# Patient Record
Sex: Female | Born: 1985 | Race: White | Hispanic: No | Marital: Married | Smoking: Never smoker
Health system: Southern US, Community
[De-identification: ages and names within clinical notes are randomized; demographics above are authoritative.]

## PROBLEM LIST (undated history)

## (undated) DIAGNOSIS — J45909 Unspecified asthma, uncomplicated: Secondary | ICD-10-CM

## (undated) DIAGNOSIS — G43909 Migraine, unspecified, not intractable, without status migrainosus: Secondary | ICD-10-CM

## (undated) DIAGNOSIS — R002 Palpitations: Secondary | ICD-10-CM

## (undated) DIAGNOSIS — R51 Headache: Secondary | ICD-10-CM

## (undated) DIAGNOSIS — B019 Varicella without complication: Secondary | ICD-10-CM

## (undated) HISTORY — DX: Headache: R51

## (undated) HISTORY — DX: Palpitations: R00.2

## (undated) HISTORY — PX: NO PAST SURGERIES: SHX2092

## (undated) HISTORY — DX: Varicella without complication: B01.9

## (undated) HISTORY — DX: Migraine, unspecified, not intractable, without status migrainosus: G43.909

## (undated) HISTORY — DX: Unspecified asthma, uncomplicated: J45.909

---

## 2012-05-02 ENCOUNTER — Ambulatory Visit: Payer: Self-pay | Admitting: Family Medicine

## 2012-05-15 ENCOUNTER — Ambulatory Visit (INDEPENDENT_AMBULATORY_CARE_PROVIDER_SITE_OTHER): Payer: BC Managed Care – PPO | Admitting: Family Medicine

## 2012-05-15 ENCOUNTER — Encounter: Payer: Self-pay | Admitting: Family Medicine

## 2012-05-15 VITALS — BP 100/72 | HR 73 | Temp 98.9°F | Ht 65.75 in | Wt 223.0 lb

## 2012-05-15 DIAGNOSIS — Z7689 Persons encountering health services in other specified circumstances: Secondary | ICD-10-CM

## 2012-05-15 DIAGNOSIS — R05 Cough: Secondary | ICD-10-CM

## 2012-05-15 DIAGNOSIS — R0982 Postnasal drip: Secondary | ICD-10-CM

## 2012-05-15 DIAGNOSIS — E669 Obesity, unspecified: Secondary | ICD-10-CM

## 2012-05-15 DIAGNOSIS — Z23 Encounter for immunization: Secondary | ICD-10-CM

## 2012-05-15 DIAGNOSIS — J45909 Unspecified asthma, uncomplicated: Secondary | ICD-10-CM

## 2012-05-15 DIAGNOSIS — Z7189 Other specified counseling: Secondary | ICD-10-CM

## 2012-05-15 MED ORDER — FLUTICASONE PROPIONATE 50 MCG/ACT NA SUSP: 2.0000 | Freq: Every day | NASAL | Status: DC

## 2012-05-15 MED ORDER — ALBUTEROL SULFATE HFA 108 (90 BASE) MCG/ACT IN AERS: 2.0000 | INHALATION_SPRAY | Freq: Four times a day (QID) | RESPIRATORY_TRACT | Status: DC | PRN

## 2012-05-15 NOTE — Addendum Note (Signed)
Addended by: Azucena Freed on: 05/15/2012 03:47 PM   Modules accepted: Orders

## 2012-05-15 NOTE — Patient Instructions (Addendum)
-  We have ordered labs or studies at this visit. It can take up to 1-2 weeks for results and processing. We will contact you with instructions IF your results are abnormal. Normal results will be released to your [facility]. If you have not heard from [place] or can not find your results in [facility] in 2 weeks please contact our office.  -PLEASE SIGN UP FOR MYCHART TODAY   We recommend the following healthy lifestyle measures: - eat a healthy diet consisting of lots of vegetables, fruits, beans, nuts, seeds, healthy meats such as white chicken and fish and whole grains.  - avoid fried foods, fast food, processed foods, sodas, red meet and other fattening foods.  - get a least 150 minutes of aerobic exercise per week.   FOR YOUR COUGH: -We placed a referral for you for lung testing as discussed. It usually takes about 1-2 weeks to process and schedule this referral. If you have not heard from [place] regarding this appointment in 2 weeks please contact our office. -please use the albuterol and flonase as instructed  Follow up in: 1 month or sooner if any worsening of symptoms or concerns

## 2012-05-15 NOTE — Progress Notes (Addendum)
Chief Complaint  Patient presents with  . Establish Care    HPI:  Diane Wheeler is here to establish care.  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: married Spiritual Beliefs: christian Exercise: gym 3-4 times per week cardio and strength training - has lost 10lbs, has been working on diet - cutting out diet and greasy foods and sweet and balancing portions  Has the following chronic problems and concerns today:  Cough: -worse every winter -symptoms: productive cough ongoing for a bout a month and yearly in the winter, drainage in throat, occ rhinorrhea -denies: fevers, wheezing, SOB, though does have some wheezing of runs outside (not inside), hx of childhood asthma - but no tx for asthma since childhood, unintentional weight loss -no reflux symptoms, SOB, fevers  Other Providers: -[facility] - last exam in feb with pap, normal paps  Health Maintenance: -UTD on pap -had flu this year, last tetanus booster 2003 -had lipids and fasting blood glu tested Nov 2013 total chol 159, LDL 83, fasting blood glu 87  ROS: See pertinent positives and negatives per HPI.  Past Medical History  Diagnosis Date  . Chicken pox   . Headache     frequent   . Migraines   . Asthma     as a child, occ flares when runs outside    Family History  Problem Relation Age of Onset  . Alcohol abuse Other     father's side of family  . Arthritis Maternal Grandfather   . Arthritis Maternal Grandmother   . Heart disease    . Ovarian cancer Maternal Aunt   . Uterine cancer Maternal Aunt   . Hypertension Mother   . Hypertension Maternal Grandmother   . Depression      maternal side  . Anxiety disorder      maternal side   . Diabetes Mother   . Diabetes Maternal Grandfather   . Stroke Maternal Grandfather   . Stroke Maternal Grandmother   . Heart disease Maternal Grandfather   . Heart disease Paternal Grandmother     History   Social History  .  Marital Status: Married    Spouse Name: N/A    Number of Children: N/A  . Years of Education: N/A   Social History Main Topics  . Smoking status: Never Smoker   . Smokeless tobacco: None  . Alcohol Use: Yes     Comment: occ, under safe drinking limits  . Drug Use: No  . Sexually Active: Yes    Birth Control/ Protection: Condom     Comment: doesn't tolerate OCPs - doesn't like the side effects   Other Topics Concern  . None   Social History Narrative  . None    Current outpatient prescriptions:albuterol (PROVENTIL HFA;VENTOLIN HFA) 108 (90 BASE) MCG/ACT inhaler, Inhale 2 puffs into the lungs every 6 (six) hours as needed for wheezing., Disp: 1 Inhaler, Rfl: 2;  fluticasone (FLONASE) 50 MCG/ACT nasal spray, Place 2 sprays into the nose daily., Disp: 16 g, Rfl: 6  EXAM:  Filed Vitals:   05/15/12 1428  BP: 100/72  Pulse: 73  Temp: 98.9 F (37.2 C)    Body mass index is 36.27 kg/(m^2).  GENERAL: vitals reviewed and listed above, alert, oriented, appears well hydrated and in no acute distress  HEENT: atraumatic, conjunttiva clear, no obvious abnormalities on inspection of external nose and ears, allergic shiners, PND, pale boggy nasal turbinates  NECK: no obvious masses on inspection  LUNGS: clear to auscultation bilaterally, no wheezes, rales or rhonchi, good air movement  CV: HRRR, no peripheral edema  MS: moves all extremities without noticeable abnormality  PSYCH: pleasant and cooperative, no obvious depression or anxiety  ASSESSMENT AND PLAN:  Discussed the following assessment and plan:  1. Obesity (BMI 30-39.9)    2. Encounter to establish care    3. Cough  -exam c/w  AR with PND - however with hx of asthma will rx albuterol and obtain PFTs Pulmonary function test, albuterol, flonase, return precautions -follow up 1 month  4. Asthma  Pulmonary function test, albuterol (PROVENTIL HFA;VENTOLIN HFA) 108 (90 BASE) MCG/ACT inhaler  5. PND (post-nasal drip)      -We reviewed the PMH, PSH, FH, SH, Meds and Allergies. -We provided refills for any medications we will prescribe as needed. -We addressed current concerns per orders and patient instructions. -We have asked for records for pertinent exams, studies, vaccines and notes from previous providers. -We have advised patient to follow up per instructions below. -tdap vaccine given today  -Patient advised to return or notify a doctor immediately if symptoms worsen or persist or new concerns arise.  Patient Instructions  -We have ordered labs or studies at this visit. It can take up to 1-2 weeks for results and processing. We will contact you with instructions IF your results are abnormal. Normal results will be released to your [facility]. If you have not heard from [place] or can not find your results in [facility] in 2 weeks please contact our office.  -PLEASE SIGN UP FOR MYCHART TODAY   We recommend the following healthy lifestyle measures: - eat a healthy diet consisting of lots of vegetables, fruits, beans, nuts, seeds, healthy meats such as white chicken and fish and whole grains.  - avoid fried foods, fast food, processed foods, sodas, red meet and other fattening foods.  - get a least 150 minutes of aerobic exercise per week.   FOR YOUR COUGH: -We placed a referral for you for lung testing as discussed. It usually takes about 1-2 weeks to process and schedule this referral. If you have not heard from [place] regarding this appointment in 2 weeks please contact our office. -please use the albuterol and flonase as instructed  Follow up in: 1 month or sooner if any worsening of symptoms or concerns      KIM, HANNAH R.

## 2012-06-23 ENCOUNTER — Ambulatory Visit (INDEPENDENT_AMBULATORY_CARE_PROVIDER_SITE_OTHER): Payer: BC Managed Care – PPO | Admitting: Internal Medicine

## 2012-06-23 DIAGNOSIS — R05 Cough: Secondary | ICD-10-CM

## 2012-06-23 DIAGNOSIS — J45909 Unspecified asthma, uncomplicated: Secondary | ICD-10-CM

## 2012-06-23 LAB — PULMONARY FUNCTION TEST

## 2012-06-23 NOTE — Progress Notes (Signed)
PFT done today. 

## 2012-07-08 ENCOUNTER — Telehealth: Payer: Self-pay | Admitting: Family Medicine

## 2012-07-08 NOTE — Telephone Encounter (Signed)
Please let her know:  Lung testing did not show asthma. There were a few minor things we can discuss. She needs a follow up appointment to see how her cough is going - please schedule.

## 2012-07-11 NOTE — Telephone Encounter (Signed)
Left message on voicemail.

## 2012-07-12 NOTE — Telephone Encounter (Signed)
Left a message for return call.  

## 2012-07-13 NOTE — Telephone Encounter (Signed)
Called and spoke with pt about testing.  Pt states she made an appt for the 27th of Feb.  Advised pt to call office back for sooner appt if symptoms worsen or pt has new symptoms.    Olegario Messier spoke with pt and made appt.

## 2012-08-03 ENCOUNTER — Ambulatory Visit (INDEPENDENT_AMBULATORY_CARE_PROVIDER_SITE_OTHER): Payer: BC Managed Care – PPO | Admitting: Family Medicine

## 2012-08-03 ENCOUNTER — Encounter: Payer: Self-pay | Admitting: Family Medicine

## 2012-08-03 VITALS — BP 102/76 | HR 76 | Temp 98.9°F | Wt 220.0 lb

## 2012-08-03 DIAGNOSIS — Z309 Encounter for contraceptive management, unspecified: Secondary | ICD-10-CM

## 2012-08-03 NOTE — Patient Instructions (Signed)
-  We placed a referral for you as discussed. It usually takes about 1-2 weeks to process and schedule this referral. If you have not heard from [place] regarding this appointment in 2 weeks please contact our office.  -continue the Flonase  -follow up with me in 3 months or sooner if concerns

## 2012-08-03 NOTE — Progress Notes (Signed)
Chief Complaint  Patient presents with  . Follow-up    to discuss PFT results  . Contraception    HPI:  Cough/PND: -every winter, childhood asthma, wheezing when runs outside in cold air -PFTs without obstructive disease and without response to bronchodilator, mild restrictive pattern with reduced diffusion cap -last visit had tried flonase and alb prior to PFTs  -currently reports symptoms have improved in terms of the PND -still having nonproductive cough daily and admits to getting SOB fairly easily  ROS: See pertinent positives and negatives per HPI.  Past Medical History  Diagnosis Date  . Chicken pox   . Headache     frequent   . Migraines   . Asthma     as a child, occ flares when runs outside    Family History  Problem Relation Age of Onset  . Alcohol abuse Other     father's side of family  . Arthritis Maternal Grandfather   . Arthritis Maternal Grandmother   . Heart disease    . Ovarian cancer Maternal Aunt   . Uterine cancer Maternal Aunt   . Hypertension Mother   . Hypertension Maternal Grandmother   . Depression      maternal side  . Anxiety disorder      maternal side   . Diabetes Mother   . Diabetes Maternal Grandfather   . Stroke Maternal Grandfather   . Stroke Maternal Grandmother   . Heart disease Maternal Grandfather   . Heart disease Paternal Grandmother     History   Social History  . Marital Status: Married    Spouse Name: N/A    Number of Children: N/A  . Years of Education: N/A   Social History Main Topics  . Smoking status: Never Smoker   . Smokeless tobacco: None  . Alcohol Use: Yes     Comment: occ, under safe drinking limits  . Drug Use: No  . Sexually Active: Yes    Birth Control/ Protection: Condom     Comment: doesn't tolerate OCPs - doesn't like the side effects   Other Topics Concern  . 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      Home Situation: married      Spiritual Beliefs: christian      Exercise: gym 3-4 times per week cardio and strength training, has been working on diet - cutting out diet and greasy foods and sweet and balancing portions    Current outpatient prescriptions:albuterol (PROVENTIL HFA;VENTOLIN HFA) 108 (90 BASE) MCG/ACT inhaler, Inhale 2 puffs into the lungs every 6 (six) hours as needed for wheezing., Disp: 1 Inhaler, Rfl: 2;  fluticasone (FLONASE) 50 MCG/ACT nasal spray, Place 2 sprays into the nose daily., Disp: 16 g, Rfl: 6  EXAM:  Filed Vitals:   08/03/12 1514  BP: 102/76  Pulse: 76  Temp: 98.9 F (37.2 C)    Body mass index is 35.78 kg/(m^2).  GENERAL: vitals reviewed and listed above, alert, oriented, appears well hydrated and in no acute distress  HEENT: atraumatic, conjunttiva clear, no obvious abnormalities on inspection of external nose and ears  NECK: no obvious masses on inspection  LUNGS: clear to auscultation bilaterally, no wheezes, rales or rhonchi, good air movement  CV: HRRR, no peripheral edema  MS: moves all extremities without noticeable abnormality  PSYCH: pleasant and cooperative, no obvious depression or anxiety  ASSESSMENT AND PLAN:  Discussed the following assessment and plan:  Chronic  cough - Plan: Ambulatory referral to Pulmonology  Dyspnea - Plan: Ambulatory referral to Pulmonology  Allergic rhinitis  Contraception  -given ongoing symptoms of SOB and cough for several years and PFT findings not suggesting asthma but with mild restrictive pattern and decreased diffusion will refer to pulm for further evaluation -discussed that obesity may contribute to these findings, but discussed other potential etiologies as well - she does admit to a case of very bad pneumonia as a child -patient is agreeable to this plan  -she will continue the flonase -discussed different types of contraception and she will discuss and decide with her gynecologist -Patient advised to return or  notify a doctor immediately if symptoms worsen or persist or new concerns arise.  Patient Instructions  -We placed a referral for you as discussed. It usually takes about 1-2 weeks to process and schedule this referral. If you have not heard from [place] regarding this appointment in 2 weeks please contact our office.  -continue the Flonase  -follow up with me in 3 months or sooner if concerns     Diane Meath R.

## 2012-08-11 ENCOUNTER — Institutional Professional Consult (permissible substitution): Payer: BC Managed Care – PPO | Admitting: Pulmonary Disease

## 2012-09-08 ENCOUNTER — Ambulatory Visit (INDEPENDENT_AMBULATORY_CARE_PROVIDER_SITE_OTHER): Payer: BC Managed Care – PPO | Admitting: Pulmonary Disease

## 2012-09-08 ENCOUNTER — Encounter: Payer: Self-pay | Admitting: Pulmonary Disease

## 2012-09-08 ENCOUNTER — Ambulatory Visit (INDEPENDENT_AMBULATORY_CARE_PROVIDER_SITE_OTHER)
Admission: RE | Admit: 2012-09-08 | Discharge: 2012-09-08 | Disposition: A | Discharge status: Home / Self Care | Payer: BC Managed Care – PPO | Source: Ambulatory Visit | Attending: Pulmonary Disease | Admitting: Pulmonary Disease

## 2012-09-08 VITALS — BP 128/82 | HR 69 | Temp 97.9°F | Ht 66.0 in | Wt 221.0 lb

## 2012-09-08 DIAGNOSIS — R942 Abnormal results of pulmonary function studies: Secondary | ICD-10-CM

## 2012-09-08 NOTE — Patient Instructions (Addendum)
Your breathing studies show normal airflow, but you are not taking a normal depth of breath that I suspect is weight related.   Will check a chest xray today to make sure no obvious abnormality in the lungs As long as you are asymptomatic, feeling you are doing well, no further evaluation needed.

## 2012-09-08 NOTE — Assessment & Plan Note (Signed)
The patient has normal spirometry, but does have minimal decrease in total lung capacity with a mild decrease in diffusion capacity.  She is denying any significant dyspnea on exertion except with heavier activities, and her prior cough has almost totally resolved with treatment of postnasal drip.  She tells me that she is exercising on a regular basis, and that her breathing has not interfered with this.  I had a long discussion with her about her pulmonary function studies, and explained that normal spirometry does not exclude the possibility of asthma.  If she begins to have more consistent symptoms, I would like to see her back to see if we can document airflow limitation when she is symptomatic.  The other option is to check a methacholine challenge test for definitive diagnosis.  Regarding her minimal restriction in diffusion abnormality, I suspect this is secondary to her obesity.  Her diffusion capacity corrects with alveolar volume adjustment.  I will check a chest x-ray for completeness to make sure that she does not have an interstitial process, but her lungs were clear today on exam.  If her chest x-ray is normal, I will see her back on an as needed basis.  I have encouraged her to work aggressively on weight loss.

## 2012-09-08 NOTE — Progress Notes (Signed)
  Subjective:    Patient ID: Diane Wheeler, female    DOB: 09/10/1985, 27 y.o.   MRN: [id number]  HPI The patient is a 27 year old female asked to see for abnormal pulmonary function studies.  She had issues with a dry hacking cough during the cold weather months, but denies any breathing issues to me except with heavy exertion.  She underwent pulmonary function studies that showed no airflow obstruction, but did show a minimal restriction and a mild decrease in diffusion capacity that corrected with alveolar volume adjustment.  The patient has a history of asthma during childhood starting at age 54, and she used an inhaler as needed.  This totally resolved by her late teenage years.  She tells me that she exercises on a regular basis, and has no difficulties unless she tries to do intense exercise such as running.  She can bring groceries in from the car or go up one flight of stairs without getting winded.  Her cough has nearly resolved with treatment of postnasal drip, and she denies any mucus production.  She has not had any lower extremity edema.  The patient has not had a recent chest x-ray.   Review of Systems  Constitutional: Negative for fever and unexpected weight change.  HENT: Positive for sore throat (winter time). Negative for ear pain, nosebleeds, congestion, rhinorrhea, sneezing, trouble swallowing, dental problem, postnasal drip and sinus pressure.   Eyes: Negative for redness and itching.  Respiratory: Positive for shortness of breath ( with running outdoors). Negative for cough, chest tightness and wheezing.   Cardiovascular: Negative for palpitations and leg swelling.  Gastrointestinal: Negative for nausea and vomiting.  Genitourinary: Negative for dysuria.  Musculoskeletal: Negative for joint swelling.  Skin: Negative for rash.  Neurological: Positive for headaches.  Hematological: Does not bruise/bleed easily.  Psychiatric/Behavioral: Negative for dysphoric mood. The  patient is not nervous/anxious.        Objective:   Physical Exam Constitutional:  Obese female, no acute distress  HENT:  Nares patent without discharge  Oropharynx without exudate, palate and uvula are normal  Eyes:  Perrla, eomi, no scleral icterus  Neck:  No JVD, no TMG  Cardiovascular:  Normal rate, regular rhythm, no rubs or gallops.  No murmurs        Intact distal pulses  Pulmonary :  Normal breath sounds, no stridor or respiratory distress   No rales, rhonchi, or wheezing  Abdominal:  Soft, nondistended, bowel sounds present.  No tenderness noted.   Musculoskeletal:  No lower extremity edema noted.  Lymph Nodes:  No cervical lymphadenopathy noted  Skin:  No cyanosis noted  Neurologic:  Alert, appropriate, moves all 4 extremities without obvious deficit.         Assessment & Plan:

## 2013-02-16 ENCOUNTER — Telehealth: Payer: Self-pay

## 2013-02-16 NOTE — Telephone Encounter (Signed)
Patient had requested copy of MRI from last year. She had moved and consequently did not receive it. I will resend to new address.   When discussing patient's original symptoms of migraine, patient states that overall she has been able to manage them well with little difficulty.

## 2013-03-05 ENCOUNTER — Encounter: Payer: Self-pay | Admitting: Family Medicine

## 2013-03-05 ENCOUNTER — Ambulatory Visit (INDEPENDENT_AMBULATORY_CARE_PROVIDER_SITE_OTHER): Payer: BC Managed Care – PPO | Admitting: Family Medicine

## 2013-03-05 VITALS — BP 124/80 | Temp 98.0°F | Wt 208.0 lb

## 2013-03-05 DIAGNOSIS — J069 Acute upper respiratory infection, unspecified: Secondary | ICD-10-CM

## 2013-03-05 NOTE — Progress Notes (Signed)
Chief Complaint  Patient presents with  . Sore Throat    dry cough    HPI:  Acute visit for:  1) Sore throat: -started yesterday -symptoms: drainage, cough, sore throat -denies: ear pain, tooth pain, SOB, fever, NVD -friend with similar symptoms  ROS: See pertinent positives and negatives per HPI.  Past Medical History  Diagnosis Date  . Chicken pox   . Headache(784.0)     frequent   . Migraines   . Asthma     as a child, occ flares when runs outside    Past Surgical History  Procedure Laterality Date  . No past surgeries      Family History  Problem Relation Age of Onset  . Alcohol abuse Other     father's side of family  . Arthritis Maternal Grandfather   . Arthritis Maternal Grandmother   . Heart disease    . Ovarian cancer Maternal Aunt   . Uterine cancer Maternal Aunt   . Hypertension Mother   . Hypertension Maternal Grandmother   . Depression      maternal side  . Anxiety disorder      maternal side   . Diabetes Mother   . Diabetes Maternal Grandfather   . Stroke Maternal Grandfather   . Stroke Maternal Grandmother   . Heart disease Maternal Grandfather   . Heart disease Paternal Grandmother     History   Social History  . Marital Status: Married    Spouse Name: N/A    Number of Children: N/A  . 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 Smoking status: Never Smoker   . Smokeless tobacco: None  . Alcohol Use: Yes     Comment: occ, under safe drinking limits  . Drug Use: No  . Sexual Activity: Yes    Birth Control/ Protection: Condom     Comment: doesn't tolerate OCPs - doesn't like the side effects   Other Topics Concern  . 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  GENERAL: vitals reviewed and listed above, alert, oriented, appears well hydrated and in no acute distress  HEENT: atraumatic, conjunttiva clear, no obvious abnormalities on inspection of external nose and ears, normal appearance of ear canals and TMs, clear nasal congestion, mild post oropharyngeal erythema with PND, no tonsillar edema or exudate, no sinus TTP  NECK: no obvious masses on inspection  LUNGS: clear to auscultation bilaterally, no wheezes, rales or rhonchi, good air movement  CV: HRRR, no peripheral edema  MS: moves all extremities without noticeable abnormality  PSYCH: pleasant and cooperative, no obvious depression or anxiety  ASSESSMENT AND PLAN:  Discussed the following assessment and plan:  Upper respiratory infection  -hx and exam c/w viral upper resp infection -supportive care and return precuations advised -Patient advised  to return or notify a doctor immediately if symptoms worsen or persist or new concerns arise.  Patient Instructions  INSTRUCTIONS FOR UPPER RESPIRATORY INFECTION:  -plenty of rest and fluids  -nasal saline wash 2-3 times daily (use prepackaged nasal saline or bottled/distilled water if making your own)   -can use afrin or sinex nasal spray for drainage and nasal congestion - but do NOT use longer then 3-4 days  -can use tylenol or ibuprofen as directed for aches and sorethroat  -in the winter time, using a humidifier at night is helpful (please follow cleaning instructions)  -if you are taking a cough medication - use only as directed, may also try a teaspoon of honey to coat the throat and  throat lozenges  -for sore throat, salt water gargles can help  -follow up if you have fevers, facial pain, tooth pain, difficulty breathing or are worsening or not getting better in 5-7 days      Kyani Simkin R.

## 2013-03-05 NOTE — Patient Instructions (Signed)
INSTRUCTIONS FOR UPPER RESPIRATORY INFECTION:  -plenty of rest and fluids  -nasal saline wash 2-3 times daily (use prepackaged nasal saline or bottled/distilled water if making your own)   -can use afrin or sinex nasal spray for drainage and nasal congestion - but do NOT use longer then 3-4 days  -can use tylenol or ibuprofen as directed for aches and sorethroat  -in the winter time, using a humidifier at night is helpful (please follow cleaning instructions)  -if you are taking a cough medication - use only as directed, may also try a teaspoon of honey to coat the throat and throat lozenges  -for sore throat, salt water gargles can help  -follow up if you have fevers, facial pain, tooth pain, difficulty breathing or are worsening or not getting better in 5-7 days  

## 2013-03-13 ENCOUNTER — Encounter: Payer: Self-pay | Admitting: Family Medicine

## 2013-07-12 ENCOUNTER — Emergency Department (HOSPITAL_COMMUNITY)
Admission: EM | Admit: 2013-07-12 | Discharge: 2013-07-12 | Disposition: A | Discharge status: Home / Self Care | Payer: BC Managed Care – PPO | Attending: Emergency Medicine | Admitting: Emergency Medicine

## 2013-07-12 ENCOUNTER — Encounter (HOSPITAL_COMMUNITY): Payer: Self-pay | Admitting: Emergency Medicine

## 2013-07-12 ENCOUNTER — Telehealth: Payer: Self-pay | Admitting: Family Medicine

## 2013-07-12 ENCOUNTER — Emergency Department (HOSPITAL_COMMUNITY): Payer: BC Managed Care – PPO

## 2013-07-12 DIAGNOSIS — Z8619 Personal history of other infectious and parasitic diseases: Secondary | ICD-10-CM | POA: Insufficient documentation

## 2013-07-12 DIAGNOSIS — J45909 Unspecified asthma, uncomplicated: Secondary | ICD-10-CM | POA: Insufficient documentation

## 2013-07-12 DIAGNOSIS — Z8679 Personal history of other diseases of the circulatory system: Secondary | ICD-10-CM | POA: Insufficient documentation

## 2013-07-12 DIAGNOSIS — Z880 Allergy status to penicillin: Secondary | ICD-10-CM | POA: Insufficient documentation

## 2013-07-12 DIAGNOSIS — R002 Palpitations: Secondary | ICD-10-CM | POA: Insufficient documentation

## 2013-07-12 DIAGNOSIS — Z3202 Encounter for pregnancy test, result negative: Secondary | ICD-10-CM | POA: Insufficient documentation

## 2013-07-12 DIAGNOSIS — R42 Dizziness and giddiness: Secondary | ICD-10-CM | POA: Insufficient documentation

## 2013-07-12 LAB — CBC
HCT: 40.2 % (ref 36.0–46.0)
Hemoglobin: 13.8 g/dL (ref 12.0–15.0)
MCH: 29.7 pg (ref 26.0–34.0)
MCHC: 34.3 g/dL (ref 30.0–36.0)
MCV: 86.6 fL (ref 78.0–100.0)
Platelets: 315 10*3/uL (ref 150–400)
RBC: 4.64 MIL/uL (ref 3.87–5.11)
RDW: 13.1 % (ref 11.5–15.5)
WBC: 11.3 10*3/uL — ABNORMAL HIGH (ref 4.0–10.5)

## 2013-07-12 LAB — BASIC METABOLIC PANEL
BUN: 8 mg/dL (ref 6–23)
CHLORIDE: 103 meq/L (ref 96–112)
CO2: 25 meq/L (ref 19–32)
Calcium: 9.2 mg/dL (ref 8.4–10.5)
Creatinine, Ser: 0.67 mg/dL (ref 0.50–1.10)
GFR calc non Af Amer: >90 mL/min (ref >90)
Glucose, Bld: 82 mg/dL (ref 70–99)
POTASSIUM: 4.2 meq/L (ref 3.7–5.3)
Sodium: 143 mEq/L (ref 137–147)

## 2013-07-12 LAB — URINALYSIS, ROUTINE W REFLEX MICROSCOPIC
BILIRUBIN URINE: NEGATIVE
GLUCOSE, UA: NEGATIVE mg/dL
Ketones, ur: NEGATIVE mg/dL
Nitrite: NEGATIVE
PH: 7 (ref 5.0–8.0)
Protein, ur: NEGATIVE mg/dL
Specific Gravity, Urine: 1.006 (ref 1.005–1.030)
Urobilinogen, UA: 0.2 mg/dL (ref 0.0–1.0)

## 2013-07-12 LAB — POCT I-STAT TROPONIN I: TROPONIN I, POC: 0 ng/mL (ref 0.00–0.08)

## 2013-07-12 LAB — URINE MICROSCOPIC-ADD ON

## 2013-07-12 LAB — PREGNANCY, URINE: Preg Test, Ur: NEGATIVE

## 2013-07-12 NOTE — Telephone Encounter (Signed)
Patient Information:  Caller Name: Diane Wheeler  Phone: [phone]  Patient: Diane Wheeler, Diane Wheeler  Gender: Female  DOB: 1986/01/20  Age: 28 Years  PCP: Diane Wheeler (TEXT 1st, after 20 mins can call), Diane Wheeler [facility](Family Practice)  Pregnant: No  Office Follow Up:  Does the office need to follow up with this patient?: No  Instructions For The Office: N/A  RN Note:  Advised to have her husband drive her to ED now at [facility]: Diane Wheeler. Agreed to plan. (spoke to [place] in office before sending pt to ED)  Symptoms  Reason For Call & Symptoms: Chest discomfort (states its not really pain but is a weird feeling) described as fluttering or and squeezing at the top of her heart intermittently and then a kick feeling once and a while 'at the bottom' of her heart.  increasing in frequency; very dizzy and faint and fatigued with these sxs yesterday also had transient numbness in feet yesterday. Feeling a lot better today but she has been home in bed all day. Not dizzy currently but still having the feeling in her chest and states it woke her from sleep last night also.  Reviewed Health History In EMR: Yes  Reviewed Medications In EMR: Yes  Reviewed Allergies In EMR: Yes  Reviewed Surgeries / Procedures: Yes  Date of Onset of Symptoms: 07/11/2013 OB / GYN:  LMP: 06/14/2013  Guideline(s) Used:  Chest Pain  Disposition Per Guideline:   Go to ED Now  Reason For Disposition Reached:   Heart beating irregularly or very rapidly  Advice Given:  N/A  Patient Will Follow Care Advice:  YES

## 2013-07-12 NOTE — ED Provider Notes (Signed)
CSN: [id number]     Arrival date & time 07/12/13  1257 History   First MD Initiated Contact with Patient 07/12/13 1651     Chief Complaint  Patient presents with  . Chest Pain   (Consider location/radiation/quality/duration/timing/severity/associated sxs/prior Treatment) HPI Comments: Diane Wheeler is a 28 y.o. year-old female with a past medical history of migraines, asthma, presenting the Emergency Department with a chief complaint of worsening palpitations for 1 year.  The patient describes the discomfort as "top part of heart" fluttering and "lower heart" with large beats.  She reports an episode yesterday that lasted 2 hours.  She reports she was seated at work when the palpations began.  She reports associated lightheadedness without syncope.  She denies chest pain. She denies aggravating factors, specifically deep inspirations, positional.  No recent travel, family history or personal history of DVT/PE, lower extremity swelling, smoking, cancer, or exogenous estrogen. Denies family history of early MI.  No history of cocaine, amphetamine use, reports occasional EtOH without an increase in use recently. She denies use of an albuterol inhaler. 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  She reports decrease in sleep over the past 4 months. Patient's last menstrual period was 4 weeks ago.  Reports she should "be starting today".    Patient is a 28 y.o. female presenting with chest pain. The history is provided by the patient and medical records. No language interpreter was used.  Chest Pain Associated symptoms: dizziness and palpitations   Associated symptoms: no abdominal pain, no cough, no fever, no headache, no nausea, no shortness of breath and not vomiting     Past Medical History  Diagnosis Date  . Chicken pox   . Headache(784.0)     frequent   . Migraines   . Asthma     as a child, occ flares when runs  outside   Past Surgical History  Procedure Laterality Date  . No past surgeries     Family History  Problem Relation Age of Onset  . Alcohol abuse Other     father's side of family  . Arthritis Maternal Grandfather   . Arthritis Maternal Grandmother   . Heart disease    . Ovarian cancer Maternal Aunt   . Uterine cancer Maternal Aunt   . Hypertension Mother   . Hypertension Maternal Grandmother   . Depression      maternal side  . Anxiety disorder      maternal side   . Diabetes Mother   . Diabetes Maternal Grandfather   . Stroke Maternal Grandfather   . Stroke Maternal Grandmother   . Heart disease Maternal Grandfather   . Heart disease Paternal Grandmother    History  Substance Use Topics  . Smoking status: Never Smoker   . Smokeless tobacco: Not on file  . Alcohol Use: Yes     Comment: occ, under safe drinking limits   OB History   Grav Para Term Preterm Abortions TAB SAB Ect Mult Living                 Review of Systems  Constitutional: Negative for fever and chills.  Respiratory: Negative for cough, chest tightness, shortness of breath and wheezing.   Cardiovascular: Positive for palpitations. Negative for chest pain and leg swelling.  Gastrointestinal: Negative for nausea, vomiting, abdominal pain and diarrhea.  Neurological: Positive for dizziness and light-headedness. Negative for headaches.  Allergies  Amoxicillin and Dairy aid  Home Medications   Current Outpatient Rx  Name  Route  Sig  Dispense  Refill  . etonogestrel-ethinyl estradiol (NUVARING) 0.12-0.015 MG/24HR vaginal ring   Vaginal   Place 1 each vaginally every 28 (twenty-eight) days. Insert vaginally and leave in place for 3 consecutive weeks, then remove for 1 week.         Marland Kitchen. ibuprofen (ADVIL,MOTRIN) 200 MG tablet   Oral   Take 400 mg by mouth every 8 (eight) hours as needed for headache.          BP 117/67  Pulse 73  Temp(Src) 97.5 F (36.4 C) (Oral)  Resp 14  SpO2 100%   LMP 07/12/2013 Physical Exam  Nursing note and vitals reviewed. Constitutional: She is oriented to person, place, and time. She appears well-developed and well-nourished. No distress.  Exam limited by patient's body habitus.    HENT:  Head: Normocephalic and atraumatic.  Eyes: No scleral icterus.  Neck: Neck supple.  Cardiovascular: Normal rate and regular rhythm.   No extrasystoles are present. Exam reveals no gallop.   No murmur heard. Pulses:      Popliteal pulses are 2+ on the right side, and 2+ on the left side.  No lower extremity edema  Pulmonary/Chest: Effort normal and breath sounds normal. No respiratory distress. She has no wheezes. She has no rales. She exhibits no tenderness.  Abdominal: Soft. There is no tenderness. There is no rebound and no guarding.  Musculoskeletal: Normal range of motion.  Neurological: She is alert and oriented to person, place, and time.  Skin: Skin is warm and dry. She is not diaphoretic.  Psychiatric: She has a normal mood and affect. Her behavior is normal. Thought content normal.    ED Course  Procedures (including critical care time) Labs Review Labs Reviewed  CBC - Abnormal; Notable for the following:    WBC 11.3 (*)    All other components within normal limits  URINALYSIS, ROUTINE W REFLEX MICROSCOPIC - Abnormal; Notable for the following:    APPearance CLOUDY (*)    Hgb urine dipstick LARGE (*)    Leukocytes, UA SMALL (*)    All other components within normal limits  URINE MICROSCOPIC-ADD ON - Abnormal; Notable for the following:    Squamous Epithelial / LPF MANY (*)    Bacteria, UA FEW (*)    All other components within normal limits  URINE CULTURE  BASIC METABOLIC PANEL  PREGNANCY, URINE  POCT I-STAT TROPONIN I   Imaging Review Dg Chest 2 View  07/12/2013   CLINICAL DATA:  Chest pain. Sensation of arrhythmia with heart fluttering.  EXAM: CHEST  2 VIEW  COMPARISON:  DG CHEST 2 VIEW dated 09/08/2012  FINDINGS: Cardiomediastinal  silhouette unremarkable. Lungs clear. Bronchovascular markings normal. Pulmonary vascularity normal. No pneumothorax. No pleural effusions. Visualized bony thorax intact. No significant interval change.  IMPRESSION: Normal and stable examination.   Electronically Signed   By: Hulan Saashomas  Lawrence M.D.   On: 07/12/2013 13:52    EKG Interpretation    Date/Time:  Thursday July 12 2013 13:01:10 EST Ventricular Rate:  90 PR Interval:  134 QRS Duration: 84 QT Interval:  332 QTC Calculation: 406 R Axis:   63 Text Interpretation:  Normal sinus rhythm with sinus arrhythmia Nonspecific T wave abnormality Abnormal ECG Sinus rhythm Artifact T wave abnormality Abnormal ekg Confirmed by Gerhard MunchLOCKWOOD, ROBERT  MD ([id number]) on 07/12/2013 4:54:23 PM  MDM   1. Intermittent palpitations    Pt with a history of worsening palpitations. RRRR on cardiac exam, no murmurs, good pulses.  CBC- slight leukocytosis, Hbg- 13.8 no sign of anemia. BMP without electrolyte abnormalities.  UA shows Hbg. Negative pregnancy. Troponin negative.  Likely PACs. Re-eval patient reports an episode of palpitations around 1815, The cardiac monitor does not show alarms concerning for arrythmia. Negative troponin x2. Discussed patient history, condition, and labs with Dr. Jeraldine Loots who agrees the patient can be evaluated as an out-pt. Discussed lab results, imaging results, and treatment plan with the patient. Advised patient to avoid caffeine, EtOH, and other common causes of palpations. Return precautions given. Reports understanding and no other concerns at this time.  Patient is stable for discharge at this time.       Clabe Seal, PA-C 07/14/13 1820

## 2013-07-12 NOTE — ED Notes (Signed)
Pt reports intermittent chest pains for past year, became more severe yesterday, having left side chest pains and palpitations along with dizziness. ekg done at triage. HR 106.

## 2013-07-12 NOTE — Telephone Encounter (Signed)
Dr. Selena BattenKim is out of the office for the remainder of the day. She will be updated.

## 2013-07-12 NOTE — Discharge Instructions (Signed)
Call for a follow up appointment with a Family or Primary Care Provider.  Call [facility] for follow up on your heart palpitations.  Return if Symptoms worsen.   Take medication as prescribed.  Try to decrease your use of caffeine. Or other common causes of palpitations (see attached).  Try to increase your sleep and lower your stress.

## 2013-07-13 LAB — URINE CULTURE
CULTURE: NO GROWTH
Colony Count: NO GROWTH

## 2013-07-16 ENCOUNTER — Telehealth: Payer: Self-pay | Admitting: Family Medicine

## 2013-07-16 NOTE — Telephone Encounter (Signed)
Ok with me 

## 2013-07-16 NOTE — Telephone Encounter (Signed)
Pt would like to switch to NP due to patient feels like Dr Selena BattenKim does not listen to her.

## 2013-07-16 NOTE — Telephone Encounter (Signed)
ok 

## 2013-07-16 NOTE — ED Provider Notes (Signed)
  Medical screening examination/treatment/procedure(s) were performed by non-physician practitioner and as supervising physician I was immediately available for consultation/collaboration.  EKG Interpretation    Date/Time:  Thursday July 12 2013 13:01:10 EST Ventricular Rate:  90 PR Interval:  134 QRS Duration: 84 QT Interval:  332 QTC Calculation: 406 R Axis:   63 Text Interpretation:  Normal sinus rhythm with sinus arrhythmia Nonspecific T wave abnormality Abnormal ECG Sinus rhythm Artifact T wave abnormality Abnormal ekg Confirmed by Gerhard MunchLOCKWOOD, Legion Discher  MD ([id number]) on 07/12/2013 4:54:23 PM               Gerhard Munchobert Avalina Benko, MD 07/16/13 [phone]

## 2013-07-18 NOTE — Telephone Encounter (Signed)
lmom for pt to call back

## 2013-07-18 NOTE — Telephone Encounter (Signed)
Pt aware appt made

## 2013-07-23 ENCOUNTER — Ambulatory Visit: Payer: BC Managed Care – PPO | Admitting: Family

## 2013-08-03 ENCOUNTER — Ambulatory Visit (INDEPENDENT_AMBULATORY_CARE_PROVIDER_SITE_OTHER): Payer: BC Managed Care – PPO | Admitting: Family

## 2013-08-03 ENCOUNTER — Encounter: Payer: Self-pay | Admitting: Family

## 2013-08-03 VITALS — BP 110/60 | HR 61 | Wt 208.0 lb

## 2013-08-03 DIAGNOSIS — R002 Palpitations: Secondary | ICD-10-CM

## 2013-08-03 DIAGNOSIS — N926 Irregular menstruation, unspecified: Secondary | ICD-10-CM

## 2013-08-03 DIAGNOSIS — R6889 Other general symptoms and signs: Secondary | ICD-10-CM

## 2013-08-03 LAB — TSH: TSH: 1.39 u[IU]/mL (ref 0.35–5.50)

## 2013-08-03 LAB — T4, FREE: Free T4: 0.94 ng/dL (ref 0.60–1.60)

## 2013-08-03 LAB — T3, FREE: T3, Free: 2.6 pg/mL (ref 2.3–4.2)

## 2013-08-03 NOTE — Progress Notes (Signed)
Subjective:    Patient ID: Diane ClauseJessica Wheeler, female    DOB: [birth year], 28 y.o.   MRN: [id number]  HPI 28 year old white female, presents today as an emergency department followup in 07/12/2013. Patient presented with palpitations that have worsened over the last one year. She has a history of PACs and SVT per patient. Denies any drug use. Reports cold intolerance and abnormal menstrual cycle.    Review of Systems  Constitutional: Negative.   HENT: Negative.   Respiratory: Negative.   Cardiovascular: Positive for palpitations. Negative for chest pain and leg swelling.  Gastrointestinal: Negative.   Endocrine: Positive for cold intolerance.  Genitourinary: Positive for menstrual problem.  Musculoskeletal: Negative.   Skin: Negative.   Neurological: Negative.   Psychiatric/Behavioral: Negative.    Past Medical History  Diagnosis Date  . Chicken pox   . Headache(784.0)     frequent   . Migraines   . Asthma     as a child, occ flares when runs outside    History   Social History  . Marital Status: Married    Spouse Name: N/A    Number of Children: N/A  . 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 Smoking status: Never Smoker   . Smokeless tobacco: Not on file  . Alcohol Use: Yes     Comment: occ, under safe drinking limits  . Drug Use: No  . Sexual Activity: Yes    Birth Control/ Protection: Condom     Comment: doesn't tolerate OCPs - doesn't like the side effects   Other Topics Concern  . 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 No past surgeries       Family History  Problem Relation Age of Onset  . Alcohol abuse Other     father's side of family  . Arthritis Maternal Grandfather   . Arthritis Maternal Grandmother   . Heart disease    . Ovarian cancer Maternal Aunt   . Uterine cancer Maternal Aunt   . Hypertension Mother   . Hypertension Maternal Grandmother   . Depression      maternal side  . Anxiety disorder      maternal side   . Diabetes Mother   . Diabetes Maternal Grandfather   . Stroke Maternal Grandfather   . Stroke Maternal Grandmother   . Heart disease Maternal Grandfather   . Heart disease Paternal Grandmother     Allergies  Allergen Reactions  . Amoxicillin     Unsure of reaction - occurred as an infant  . Dairy Aid [Lactase] Swelling    Gi upset     Current Outpatient Prescriptions on File Prior to Visit  Medication Sig Dispense Refill  . etonogestrel-ethinyl estradiol (NUVARING) 0.12-0.015 MG/24HR vaginal ring Place 1 each vaginally every 28 (twenty-eight) days. Insert vaginally and leave in place for 3 consecutive weeks, then remove for 1 week.      Marland Kitchen. ibuprofen (ADVIL,MOTRIN) 200 MG tablet Take 400 mg by mouth every 8 (eight) hours as  needed for headache.       No current facility-administered medications on file prior to visit.    BP 110/60  Pulse 61  Wt 208 lb (94.348 kg)  LMP 02/05/2015chart    Objective:   Physical Exam  Constitutional: She is oriented to person, place, and time. She appears well-developed and well-nourished.  Neck: Normal range of motion. Neck supple.  Cardiovascular: Normal rate, regular rhythm and normal heart sounds.   Pulmonary/Chest: Effort normal and breath sounds normal.  Musculoskeletal: Normal range of motion.  Neurological: She is alert and oriented to person, place, and time.  Skin: Skin is warm and dry.  Psychiatric: She has a normal mood and affect.          Assessment & Plan:  Coti was seen today for follow-up.  Diagnoses and associated  orders for this visit:  Palpitations - TSH - T3, Free - T4, Free - Ambulatory referral to Cardiology  Irregular menstrual cycle  Cold intolerance    call the office with any questions or concerns.

## 2013-08-03 NOTE — Patient Instructions (Signed)
Palpitations   A palpitation is the feeling that your heartbeat is irregular or is faster than normal. It may feel like your heart is fluttering or skipping a beat. Palpitations are usually not a serious problem. However, in some cases, you may need further medical evaluation.  CAUSES   Palpitations can be caused by:   Smoking.   Caffeine or other stimulants, such as diet pills or energy drinks.   Alcohol.   Stress and anxiety.   Strenuous physical activity.   Fatigue.   Certain medicines.   Heart disease, especially if you have a history of arrhythmias. This includes atrial fibrillation, atrial flutter, or supraventricular tachycardia.   An improperly working pacemaker or defibrillator.  DIAGNOSIS   To find the cause of your palpitations, your caregiver will take your history and perform a physical exam. Tests may also be done, including:   Electrocardiography (ECG). This test records the heart's electrical activity.   Cardiac monitoring. This allows your caregiver to monitor your heart rate and rhythm in real time.   Holter monitor. This is a portable device that records your heartbeat and can help diagnose heart arrhythmias. It allows your caregiver to track your heart activity for several days, if needed.   Stress tests by exercise or by giving medicine that makes the heart beat faster.  TREATMENT   Treatment of palpitations depends on the cause of your symptoms and can vary greatly. Most cases of palpitations do not require any treatment other than time, relaxation, and monitoring your symptoms. Other causes, such as atrial fibrillation, atrial flutter, or supraventricular tachycardia, usually require further treatment.  HOME CARE INSTRUCTIONS    Avoid:   Caffeinated coffee, tea, soft drinks, diet pills, and energy drinks.   Chocolate.   Alcohol.   Stop smoking if you smoke.   Reduce your stress and anxiety. Things that can help you relax include:   A method that measures bodily functions so  you can learn to control them (biofeedback).   Yoga.   Meditation.   Physical activity such as swimming, jogging, or walking.   Get plenty of rest and sleep.  SEEK MEDICAL CARE IF:    You continue to have a fast or irregular heartbeat beyond 24 hours.   Your palpitations occur more often.  SEEK IMMEDIATE MEDICAL CARE IF:   You develop chest pain or shortness of breath.   You have a severe headache.   You feel dizzy, or you faint.  MAKE SURE YOU:   Understand these instructions.   Will watch your condition.   Will get help right away if you are not doing well or get worse.  Document Released: 05/21/2000 Document Revised: 09/18/2012 Document Reviewed: 07/23/2011  [employer] Patient Information 2014 [employer].

## 2013-08-03 NOTE — Progress Notes (Signed)
Pre visit review using our clinic review tool, if applicable. No additional management support is needed unless otherwise documented below in the visit note. 

## 2013-08-13 ENCOUNTER — Ambulatory Visit (INDEPENDENT_AMBULATORY_CARE_PROVIDER_SITE_OTHER): Payer: BC Managed Care – PPO | Admitting: Cardiovascular Disease

## 2013-08-13 ENCOUNTER — Encounter: Payer: Self-pay | Admitting: Cardiovascular Disease

## 2013-08-13 VITALS — BP 120/80 | HR 82 | Ht 66.0 in | Wt 205.0 lb

## 2013-08-13 DIAGNOSIS — R002 Palpitations: Secondary | ICD-10-CM | POA: Insufficient documentation

## 2013-08-13 NOTE — Patient Instructions (Signed)
Dr Allyson SabalBerry has ordered an echocardiogram and a 2 week event monitor.  Dr Allyson SabalBerry will see you back after the testing.

## 2013-08-13 NOTE — Progress Notes (Signed)
08/13/2013 Diane Wheeler   15-Jan-1986  [id number]  Primary Physician CAMPBELL, Sherran Needs, FNP Primary Cardiologist: Runell Gess MD Roseanne Reno   HPI:  Diane Wheeler is a 28 year old mildly overweight married Caucasian female with no children who works at a [employer]. 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 She has no cardiovascular risk factors. Her thyroid function tests are normal. She does not use stimulants. She has discontinued caffeine intake. The episodes began approximately year ago, occur daily and last for seconds at a time.   Current Outpatient Prescriptions  Medication Sig Dispense Refill  . etonogestrel-ethinyl estradiol (NUVARING) 0.12-0.015 MG/24HR vaginal ring Place 1 each vaginally every 28 (twenty-eight) days. Insert vaginally and leave in place for 3 consecutive weeks, then remove for 1 week.      Marland Kitchen ibuprofen (ADVIL,MOTRIN) 200 MG tablet Take 400 mg by mouth every 8 (eight) hours as needed for headache.       No current facility-administered medications for this visit.    Allergies  Allergen Reactions  . Amoxicillin     Unsure of reaction - occurred as an infant  . Dairy Aid [Lactase] Swelling    Gi upset     History   Social History  . Marital Status: Married    Spouse Name: N/A    Number of Children: N/A  . 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 Smoking status: Never Smoker   . Smokeless tobacco: Not on file  . Alcohol Use: Yes     Comment: occ, under safe drinking limits  . Drug Use: No  . Sexual Activity: Yes    Birth Control/ Protection: Condom     Comment: doesn't tolerate OCPs - doesn't like the side effects   Other Topics Concern  . 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  Cardiovascular: negative for chest pain, dyspnea on exertion, edema, orthopnea, palpitations, paroxysmal nocturnal dyspnea or shortness of breath Dermatological: negative for rash Respiratory: negative for cough or wheezing Urologic: negative for hematuria Abdominal: negative for nausea, vomiting, diarrhea, bright red blood per rectum, melena, or hematemesis Neurologic: negative for visual changes, syncope, or dizziness All other systems reviewed and are otherwise negative except as noted above.    Blood pressure 120/80, pulse 82, height 5\' 6"  (1.676 m), weight 92.987 kg (205 lb).  General appearance: alert and no distress Neck: no adenopathy, no carotid bruit, no JVD, supple, symmetrical, trachea midline and thyroid not enlarged, symmetric, no tenderness/mass/nodules Lungs: clear to auscultation bilaterally Heart: regular rate and rhythm, S1, S2 normal, no murmur, click, rub or gallop Extremities: extremities normal, atraumatic, no cyanosis or edema  EKG normal sinus rhythm at 82 with no ST or T wave changes  ASSESSMENT AND PLAN:   Palpitations Ms Vasconez  is a 28 year old mildly overweight married Caucasian female referred for evaluation of palpitations.she's had palpitations for  a year. They occur daily basis. She did have an ER visit a month ago for more severe episode associated with dizziness when she arrived in the emergency room she was in sinus rhythm and on telemetry there were no arrhythmias. Thyroid Function tests are normal. She stopped caffeine intake. She denies chest pain or shortness of breath.      Runell GessJonathan J. Berry MD FACP,FACC,FAHA,  [facility] 08/13/2013 3:44 PM

## 2013-08-13 NOTE — Assessment & Plan Note (Signed)
Diane Wheeler  is a 28 year old mildly overweight married Caucasian female referred for evaluation of palpitations.she's had palpitations for a year. They occur daily basis. She did have an ER visit a month ago for more severe episode associated with dizziness when she arrived in the emergency room she was in sinus rhythm and on telemetry there were no arrhythmias. Thyroid Function tests are normal. She stopped caffeine intake. She denies chest pain or shortness of breath.

## 2013-08-14 ENCOUNTER — Encounter: Payer: Self-pay | Admitting: *Deleted

## 2013-08-14 ENCOUNTER — Ambulatory Visit: Payer: BC Managed Care – PPO | Admitting: Cardiovascular Disease

## 2013-08-27 ENCOUNTER — Encounter: Payer: Self-pay | Admitting: *Deleted

## 2013-08-27 ENCOUNTER — Ambulatory Visit (HOSPITAL_COMMUNITY)
Admission: RE | Admit: 2013-08-27 | Discharge: 2013-08-27 | Disposition: A | Discharge status: Home / Self Care | Payer: BC Managed Care – PPO | Source: Ambulatory Visit | Attending: Cardiovascular Disease | Admitting: Cardiovascular Disease

## 2013-08-27 DIAGNOSIS — I059 Rheumatic mitral valve disease, unspecified: Secondary | ICD-10-CM

## 2013-08-27 DIAGNOSIS — R002 Palpitations: Secondary | ICD-10-CM | POA: Insufficient documentation

## 2013-08-27 NOTE — Progress Notes (Signed)
2D Echo Performed 08/27/2013    Chaniece Barbato, RCS  

## 2013-09-18 ENCOUNTER — Other Ambulatory Visit: Payer: Self-pay | Admitting: *Deleted

## 2013-09-18 DIAGNOSIS — R002 Palpitations: Secondary | ICD-10-CM

## 2013-09-25 ENCOUNTER — Encounter: Payer: Self-pay | Admitting: *Deleted

## 2013-10-24 ENCOUNTER — Encounter: Payer: Self-pay | Admitting: Cardiovascular Disease

## 2013-10-24 ENCOUNTER — Ambulatory Visit (INDEPENDENT_AMBULATORY_CARE_PROVIDER_SITE_OTHER): Payer: BC Managed Care – PPO | Admitting: Cardiovascular Disease

## 2013-10-24 VITALS — BP 120/79 | HR 65 | Ht 66.0 in | Wt 206.9 lb

## 2013-10-24 DIAGNOSIS — R002 Palpitations: Secondary | ICD-10-CM

## 2013-10-24 NOTE — Patient Instructions (Signed)
Your physician wants you to follow-up in: 6 months with Dr Berry. You will receive a reminder letter in the mail two months in advance. If you don't receive a letter, please call our office to schedule the follow-up appointment.  

## 2013-10-24 NOTE — Progress Notes (Signed)
10/24/2013 Diane Wheeler   03/03/1986  [id number]  Primary Physician CAMPBELL, Sherran NeedsPADONDA BOYD, FNP Primary Cardiologist: Runell GessJonathan J. Kumar Falwell MD Roseanne RenoFACP,FACC,FAHA, FSCAI   HPI:  Diane BumpsJessica is a 28 year old mildly overweight married Caucasian female with no children who works at a [employer]. She is also an [employer] historical romance [employer].she was referred by Toya SmothersPedanda Campbell l family nurse practitioner for evaluation of palpitations. She has no cardiovascular risk factors. Her thyroid function tests are normal. She does not use stimulants. She has discontinued caffeine intake. The episodes began approximately year ago, occur daily and last for seconds at a time. She saw me back 08/13/13 a 2-D echocardiogram was performed and was normal and an event monitor showed PVCs with 2 short runs of of atrial tachycardia. She has cut out her caffeine intake and has been getting more sleep and reports that her symptoms are less noticeable    Current Outpatient Prescriptions  Medication Sig Dispense Refill  . etonogestrel-ethinyl estradiol (NUVARING) 0.12-0.015 MG/24HR vaginal ring Place 1 each vaginally every 28 (twenty-eight) days. Insert vaginally and leave in place for 3 consecutive weeks, then remove for 1 week.      Marland Kitchen. ibuprofen (ADVIL,MOTRIN) 200 MG tablet Take 400 mg by mouth every 8 (eight) hours as needed for headache.       No current facility-administered medications for this visit.    Allergies  Allergen Reactions  . Amoxicillin     Unsure of reaction - occurred as an infant  . Dairy Aid [Lactase] Swelling    Gi upset     History   Social History  . Marital Status: Married    Spouse Name: N/A    Number of Children: N/A  . 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 Smoking status: Never Smoker   . Smokeless tobacco: Not on file  . Alcohol Use: Yes     Comment: occ, under safe drinking limits  . Drug  Use: No  . Sexual Activity: Yes    Birth Control/ Protection: Condom     Comment: doesn't tolerate OCPs - doesn't like the side effects   Other Topics Concern  . 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  Cardiovascular: negative for chest pain, dyspnea on exertion, edema, orthopnea, palpitations, paroxysmal nocturnal dyspnea or shortness of breath Dermatological: negative for rash Respiratory: negative for cough or wheezing Urologic: negative for hematuria Abdominal: negative for nausea, vomiting, diarrhea, bright red blood per rectum, melena, or hematemesis Neurologic: negative for visual changes, syncope, or dizziness All other systems reviewed and are otherwise negative except as noted above.    Blood pressure 120/79, pulse 65, height 5\' 6"  (1.676 m), weight 206 lb 14.4 oz (93.849 kg).  General appearance: alert and no distress Neck: no adenopathy, no carotid bruit, no JVD, supple, symmetrical, trachea midline and thyroid not enlarged, symmetric, no tenderness/mass/nodules Lungs: clear to auscultation bilaterally Heart: regular rate and rhythm, S1, S2 normal, no murmur, click, rub  or gallop Extremities: extremities normal, atraumatic, no cyanosis or edema  EKG not performed today  ASSESSMENT AND PLAN:   Palpitations Patient had a 2-D echocardiogram which was essentially normal. An event monitor showed PVCs with 2 short runs of atrial tachycardia. Apparently her thyroid function tests were normal. Since she last saw me she's gotten more sleep and she says her symptoms are less noticeable. She has cut out  caffeine intake.      Runell GessJonathan J. Millette Halberstam MD FACP,FACC,FAHA, [facility] Campus[facility]SCAI 10/24/2013 5:10 PM

## 2013-10-24 NOTE — Assessment & Plan Note (Signed)
Patient had a 2-D echocardiogram which was essentially normal. An event monitor showed PVCs with 2 short runs of atrial tachycardia. Apparently her thyroid function tests were normal. Since she last saw me she's gotten more sleep and she says her symptoms are less noticeable. She has cut out caffeine intake.

## 2013-11-19 ENCOUNTER — Ambulatory Visit (INDEPENDENT_AMBULATORY_CARE_PROVIDER_SITE_OTHER): Payer: BC Managed Care – PPO | Admitting: Family

## 2013-11-19 ENCOUNTER — Other Ambulatory Visit (HOSPITAL_COMMUNITY)
Admission: RE | Admit: 2013-11-19 | Discharge: 2013-11-19 | Disposition: A | Discharge status: Home / Self Care | Payer: BC Managed Care – PPO | Source: Ambulatory Visit | Attending: Family | Admitting: Family

## 2013-11-19 ENCOUNTER — Encounter: Payer: Self-pay | Admitting: Family

## 2013-11-19 VITALS — BP 110/80 | HR 73 | Temp 98.7°F | Wt 205.3 lb

## 2013-11-19 DIAGNOSIS — N760 Acute vaginitis: Secondary | ICD-10-CM | POA: Insufficient documentation

## 2013-11-19 DIAGNOSIS — R300 Dysuria: Secondary | ICD-10-CM

## 2013-11-19 LAB — POCT URINALYSIS DIPSTICK
Bilirubin, UA: NEGATIVE
Glucose, UA: NEGATIVE
KETONES UA: NEGATIVE
Nitrite, UA: NEGATIVE
PH UA: 5.5
Protein, UA: NEGATIVE
Spec Grav, UA: <=1.005
Urobilinogen, UA: 0.2

## 2013-11-19 NOTE — Progress Notes (Signed)
Subjective:    Patient ID: Diane ClauseJessica Wheeler, female    DOB: 1986-02-11, 28 y.o.   MRN: [id number]  HPI  28 year old white female, a nonsmoker then today with complaints of urinary frequency, vaginal burning x1 week. She's taken Diflucan that has not helped her symptoms much. She's also increased her water intake. Denies any back pain or abdominal pain. Does report pain during intercourse. No vaginal bleeding. Denies any concerns of sexual transmitted diseases. She is married and a monogamous relationship with a female.    Review of Systems  Constitutional: Negative.   HENT: Negative.   Respiratory: Negative.   Cardiovascular: Negative.   Gastrointestinal: Negative.   Genitourinary: Positive for dysuria, vaginal discharge and vaginal pain.       Pain with intercourse.   Musculoskeletal: Negative.   Skin: Negative.   Allergic/Immunologic: Negative.   Neurological: Negative.   Hematological: Negative.   Psychiatric/Behavioral: Negative.    Past Medical History  Diagnosis Date  . Chicken pox   . Headache(784.0)     frequent   . Migraines   . Asthma     as a child, occ flares when runs outside  . Palpitations     History   Social History  . Marital Status: Married    Spouse Name: N/A    Number of Children: N/A  . 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 Smoking status: Never Smoker   . Smokeless tobacco: Not on file  . Alcohol Use: Yes     Comment: occ, under safe drinking limits  . Drug Use: No  . Sexual Activity: Yes    Birth Control/ Protection: Condom     Comment: doesn't tolerate OCPs - doesn't like the side effects   Other Topics Concern  . 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 No past surgeries      Family History  Problem Relation Age of Onset  . Alcohol abuse Other     father's side of family  . Arthritis Maternal Grandfather   . Arthritis Maternal Grandmother     also htn, copd, afib, stroke  . Heart disease    . Ovarian cancer Maternal Aunt   . Uterine cancer Maternal Aunt   . Hypertension Mother   . Hypertension Maternal Grandmother   . Depression      maternal side  . Anxiety disorder      maternal side   . Diabetes Mother   . Diabetes Maternal Grandfather   . Stroke Maternal Grandfather   . Stroke Maternal Grandmother   . Heart disease Maternal Grandfather   . Heart disease Paternal Grandmother     Allergies  Allergen Reactions  . Amoxicillin     Unsure of reaction - occurred as an infant  . Dairy Aid [Lactase] Swelling    Gi upset     Current Outpatient Prescriptions on File Prior to Visit  Medication Sig Dispense Refill  . etonogestrel-ethinyl estradiol (NUVARING) 0.12-0.015 MG/24HR vaginal ring Place 1 each vaginally  every 28 (twenty-eight) days. Insert vaginally and leave in place for 3 consecutive weeks, then remove for 1 week.      Marland Kitchen. ibuprofen (ADVIL,MOTRIN) 200 MG tablet Take 400 mg by mouth every 8 (eight) hours as needed for headache.       No current facility-administered medications on file prior to visit.    BP 110/80  Pulse 73  Temp(Src) 98.7 F (37.1 C) (Oral)  Wt 205 lb 4.8 oz (93.123 kg)  SpO2 99%chart    Objective:   Physical Exam  Constitutional: She is oriented to person, place, and time. She appears well-developed and well-nourished.  HENT:  Nose: Nose normal.  Neck: Normal range of motion. Neck supple. No thyromegaly present.  Cardiovascular: Normal rate, regular rhythm and normal heart sounds.   Pulmonary/Chest: Effort normal and breath sounds normal.  Abdominal: Soft.  Bowel sounds are normal.  Genitourinary: Vaginal discharge found.  Yellow, stringy, discharge.   Musculoskeletal: Normal range of motion.  Neurological: She is alert and oriented to person, place, and time.  Skin: Skin is warm and dry.  Psychiatric: She has a normal mood and affect.          Assessment & Plan:   Problem List Items Addressed This Visit   None    Visit Diagnoses   Vaginitis and vulvovaginitis    -  Primary    Relevant Orders       Cervicovaginal ancillary only    Dysuria        Relevant Orders       POCT urinalysis dipstick (Completed)       Urine culture     Call the office with any questions or concerns. Recheck pending the results of wet prep and sooner as needed.

## 2013-11-19 NOTE — Progress Notes (Signed)
Pre visit review using our clinic review tool, if applicable. No additional management support is needed unless otherwise documented below in the visit note. 

## 2013-11-19 NOTE — Patient Instructions (Signed)
Vaginitis Vaginitis is an inflammation of the vagina. It is most often caused by a change in the normal balance of the bacteria and yeast that live in the vagina. This change in balance causes an overgrowth of certain bacteria or yeast, which causes the inflammation. There are different types of vaginitis, but the most common types are:  Bacterial vaginosis.  Yeast infection (candidiasis).  Trichomoniasis vaginitis. This is a sexually transmitted infection (STI).  Viral vaginitis.  Atropic vaginitis.  Allergic vaginitis. CAUSES  The cause depends on the type of vaginitis. Vaginitis can be caused by:  Bacteria (bacterial vaginosis).  Yeast (yeast infection).  A parasite (trichomoniasis vaginitis)  A virus (viral vaginitis).  Low hormone levels (atrophic vaginitis). Low hormone levels can occur during pregnancy, breastfeeding, or after menopause.  Irritants, such as bubble baths, scented tampons, and feminine sprays (allergic vaginitis). Other factors can change the normal balance of the yeast and bacteria that live in the vagina. These include:  Antibiotic medicines.  Poor hygiene.  Diaphragms, vaginal sponges, spermicides, birth control pills, and intrauterine devices (IUD).  Sexual intercourse.  Infection.  Uncontrolled diabetes.  A weakened immune system. SYMPTOMS  Symptoms can vary depending on the cause of the vaginitis. Common symptoms include:  Abnormal vaginal discharge.  The discharge is white, gray, or yellow with bacterial vaginosis.  The discharge is thick, white, and cheesy with a yeast infection.  The discharge is frothy and yellow or greenish with trichomoniasis.  A bad vaginal odor.  The odor is fishy with bacterial vaginosis.  Vaginal itching, pain, or swelling.  Painful intercourse.  Pain or burning when urinating. Sometimes, there are no symptoms. TREATMENT  Treatment will vary depending on the type of infection.   Bacterial  vaginosis and trichomoniasis are often treated with antibiotic creams or pills.  Yeast infections are often treated with antifungal medicines, such as vaginal creams or suppositories.  Viral vaginitis has no cure, but symptoms can be treated with medicines that relieve discomfort. Your sexual partner should be treated as well.  Atrophic vaginitis may be treated with an estrogen cream, pill, suppository, or vaginal ring. If vaginal dryness occurs, lubricants and moisturizing creams may help. You may be told to avoid scented soaps, sprays, or douches.  Allergic vaginitis treatment involves quitting the use of the product that is causing the problem. Vaginal creams can be used to treat the symptoms. HOME CARE INSTRUCTIONS   Take all medicines as directed by your caregiver.  Keep your genital area clean and dry. Avoid soap and only rinse the area with water.  Avoid douching. It can remove the healthy bacteria in the vagina.  Do not use tampons or have sexual intercourse until your vaginitis has been treated. Use sanitary pads while you have vaginitis.  Wipe from front to back. This avoids the spread of bacteria from the rectum to the vagina.  Let air reach your genital area.  Wear cotton underwear to decrease moisture buildup.  Avoid wearing underwear while you sleep until your vaginitis is gone.  Avoid tight pants and underwear or nylons without a cotton panel.  Take off wet clothing (especially bathing suits) as soon as possible.  Use mild, non-scented products. Avoid using irritants, such as:  Scented feminine sprays.  Fabric softeners.  Scented detergents.  Scented tampons.  Scented soaps or bubble baths.  Practice safe sex and use condoms. Condoms may prevent the spread of trichomoniasis and viral vaginitis. SEEK MEDICAL CARE IF:   You have abdominal pain.  You   have a fever or persistent symptoms for more than 2 3 days.  You have a fever and your symptoms suddenly  get worse. Document Released: 03/21/2007 Document Revised: 02/16/2012 Document Reviewed: 11/04/2011 Exit[employer] Patient Information 2014 [employer].  

## 2013-11-20 ENCOUNTER — Telehealth: Payer: Self-pay | Admitting: Family

## 2013-11-20 NOTE — Telephone Encounter (Signed)
Pt is calling request UA results

## 2013-11-21 LAB — URINE CULTURE
Colony Count: NO GROWTH
Organism ID, Bacteria: NO GROWTH

## 2013-11-22 NOTE — Telephone Encounter (Signed)
Pt called back she called in on Tues for her results and said she has not heard back from any one. Pt states a rx was suppose to be called in  for her to the following pharmacy [facility] on [address]  She contacted pharmacy and there is nothing for her

## 2013-11-22 NOTE — Telephone Encounter (Signed)
Pt aware all labs negative/normal

## 2014-02-11 ENCOUNTER — Encounter (HOSPITAL_COMMUNITY): Payer: Self-pay | Admitting: Emergency Medicine

## 2014-02-11 ENCOUNTER — Emergency Department (HOSPITAL_COMMUNITY)
Admission: EM | Admit: 2014-02-11 | Discharge: 2014-02-12 | Disposition: A | Discharge status: Home / Self Care | Payer: BC Managed Care – PPO | Attending: Emergency Medicine | Admitting: Emergency Medicine

## 2014-02-11 DIAGNOSIS — Z791 Long term (current) use of non-steroidal anti-inflammatories (NSAID): Secondary | ICD-10-CM | POA: Insufficient documentation

## 2014-02-11 DIAGNOSIS — R197 Diarrhea, unspecified: Secondary | ICD-10-CM | POA: Diagnosis not present

## 2014-02-11 DIAGNOSIS — Z8619 Personal history of other infectious and parasitic diseases: Secondary | ICD-10-CM | POA: Diagnosis not present

## 2014-02-11 DIAGNOSIS — Z3202 Encounter for pregnancy test, result negative: Secondary | ICD-10-CM | POA: Diagnosis not present

## 2014-02-11 DIAGNOSIS — Z880 Allergy status to penicillin: Secondary | ICD-10-CM | POA: Diagnosis not present

## 2014-02-11 DIAGNOSIS — R1031 Right lower quadrant pain: Secondary | ICD-10-CM

## 2014-02-11 DIAGNOSIS — Z8669 Personal history of other diseases of the nervous system and sense organs: Secondary | ICD-10-CM | POA: Insufficient documentation

## 2014-02-11 DIAGNOSIS — J45909 Unspecified asthma, uncomplicated: Secondary | ICD-10-CM | POA: Diagnosis not present

## 2014-02-11 NOTE — ED Notes (Signed)
Pt states she woke up achy this morning and weak  Pt states she developed abd pain around 1230 this afternoon  Pt states the pain has progressively gotten worse  Pt states she has had diarrhea earlier today  Pt states the pain radiates into her back  Pt denies nausea and vomiting  Pt states she has been real gassy today  Pt states the pain is in her right lower quadrant

## 2014-02-12 ENCOUNTER — Emergency Department (HOSPITAL_COMMUNITY): Payer: BC Managed Care – PPO

## 2014-02-12 ENCOUNTER — Encounter (HOSPITAL_COMMUNITY): Payer: Self-pay | Admitting: Radiology

## 2014-02-12 LAB — CBC WITH DIFFERENTIAL/PLATELET
Basophils Absolute: 0 10*3/uL (ref 0.0–0.1)
Basophils Relative: 0 % (ref 0–1)
EOS ABS: 0.1 10*3/uL (ref 0.0–0.7)
EOS PCT: 1 % (ref 0–5)
HCT: 40.3 % (ref 36.0–46.0)
Hemoglobin: 13.3 g/dL (ref 12.0–15.0)
Lymphocytes Relative: 42 % (ref 12–46)
Lymphs Abs: 3.8 10*3/uL (ref 0.7–4.0)
MCH: 29 pg (ref 26.0–34.0)
MCHC: 33 g/dL (ref 30.0–36.0)
MCV: 87.8 fL (ref 78.0–100.0)
Monocytes Absolute: 0.6 10*3/uL (ref 0.1–1.0)
Monocytes Relative: 6 % (ref 3–12)
Neutro Abs: 4.6 10*3/uL (ref 1.7–7.7)
Neutrophils Relative %: 51 % (ref 43–77)
Platelets: 309 10*3/uL (ref 150–400)
RBC: 4.59 MIL/uL (ref 3.87–5.11)
RDW: 13 % (ref 11.5–15.5)
WBC: 9.1 10*3/uL (ref 4.0–10.5)

## 2014-02-12 LAB — URINALYSIS, ROUTINE W REFLEX MICROSCOPIC
BILIRUBIN URINE: NEGATIVE
Glucose, UA: NEGATIVE mg/dL
Ketones, ur: NEGATIVE mg/dL
Leukocytes, UA: NEGATIVE
NITRITE: NEGATIVE
PROTEIN: NEGATIVE mg/dL
SPECIFIC GRAVITY, URINE: 1.01 (ref 1.005–1.030)
UROBILINOGEN UA: 0.2 mg/dL (ref 0.0–1.0)
pH: 7.5 (ref 5.0–8.0)

## 2014-02-12 LAB — URINE MICROSCOPIC-ADD ON

## 2014-02-12 LAB — COMPREHENSIVE METABOLIC PANEL
ALK PHOS: 61 U/L (ref 39–117)
ALT: 23 U/L (ref 0–35)
ANION GAP: 14 (ref 5–15)
AST: 26 U/L (ref 0–37)
Albumin: 4.3 g/dL (ref 3.5–5.2)
BUN: 9 mg/dL (ref 6–23)
CO2: 24 mEq/L (ref 19–32)
Calcium: 9.9 mg/dL (ref 8.4–10.5)
Chloride: 102 mEq/L (ref 96–112)
Creatinine, Ser: 0.66 mg/dL (ref 0.50–1.10)
GFR calc non Af Amer: >90 mL/min (ref >90)
Glucose, Bld: 93 mg/dL (ref 70–99)
Potassium: 4.1 mEq/L (ref 3.7–5.3)
Sodium: 140 mEq/L (ref 137–147)
TOTAL PROTEIN: 8.2 g/dL (ref 6.0–8.3)
Total Bilirubin: <0.2 mg/dL — ABNORMAL LOW (ref 0.3–1.2)

## 2014-02-12 LAB — WET PREP, GENITAL
CLUE CELLS WET PREP: NONE SEEN
Trich, Wet Prep: NONE SEEN
YEAST WET PREP: NONE SEEN

## 2014-02-12 LAB — PREGNANCY, URINE: Preg Test, Ur: NEGATIVE

## 2014-02-12 MED ORDER — IOHEXOL 300 MG/ML  SOLN
100.0000 mL | Freq: Once | INTRAMUSCULAR | Status: AC | PRN
  Administered 2014-02-12: 100 mL via INTRAVENOUS

## 2014-02-12 MED ORDER — IOHEXOL 300 MG/ML  SOLN
50.0000 mL | Freq: Once | INTRAMUSCULAR | Status: AC | PRN
  Administered 2014-02-12: 50 mL via ORAL

## 2014-02-12 MED ORDER — NAPROXEN 500 MG PO TABS: 500.0000 mg | ORAL_TABLET | Freq: Two times a day (BID) | ORAL | Status: DC

## 2014-02-12 NOTE — ED Provider Notes (Signed)
Previous workup performed by Earley Favor. She will for discharge pending results of CT abdomen. No evidence of appendicitis on CT abdomen. Incidental finding of bilateral ovarian cystic changes. Informed patient of CT results. Recommended follow up with PCP and/or gynecologist. Will discharge with NSAIDs and return precautions. Patient amenable to plan  Sharlene Motts, PA-C 02/12/14 [phone]

## 2014-02-12 NOTE — ED Provider Notes (Signed)
Medical screening examination/treatment/procedure(s) were performed by non-physician practitioner and as supervising physician I was immediately available for consultation/collaboration.   EKG Interpretation None       Jamariah Tony K Yariana Hoaglund-Rasch, MD 02/12/14 2351 

## 2014-02-12 NOTE — ED Provider Notes (Signed)
CSN: [id number]     Arrival date & time 02/11/14  2308 History   First MD Initiated Contact with Patient 02/12/14 [phone]     Chief Complaint  Patient presents with  . Abdominal Pain     (Consider location/radiation/quality/duration/timing/severity/associated sxs/prior Treatment) HPI Comments: Patient states she woke early Monday morning with abdominal pain is progressed throughout the day.  Decreased appetite.  She said several loose bowel movement earlier in the day and since, then.  Denies any dysuria, states, that when she walks changes positions.  She feels discomfort in her right lower quadrant.  Does have any significant GYN history no history of ovarian cysts last menstrual cycle was approximately one week ago.  Denies any vaginal discharge.  Has one sexual partner  Patient is a 28 y.o. female presenting with abdominal pain. The history is provided by the patient.  Abdominal Pain Pain location:  RLQ Pain quality: aching   Pain radiates to:  Does not radiate Pain severity:  Moderate Onset quality:  Gradual Duration:  12 hours Timing:  Constant Progression:  Worsening Chronicity:  New Context: not diet changes and not laxative use   Relieved by:  Nothing Worsened by:  Nothing tried Ineffective treatments:  None tried Associated symptoms: diarrhea   Associated symptoms: no chills, no constipation, no dysuria, no fever, no nausea, no vaginal bleeding, no vaginal discharge and no vomiting   Risk factors: obesity   Risk factors: not pregnant     Past Medical History  Diagnosis Date  . Chicken pox   . Headache(784.0)     frequent   . Migraines   . Asthma     as a child, occ flares when runs outside  . Palpitations    Past Surgical History  Procedure Laterality Date  . No past surgeries     Family History  Problem Relation Age of Onset  . Alcohol abuse Other     father's side of family  . Arthritis Maternal Grandfather   . Arthritis Maternal Grandmother     also htn,  copd, afib, stroke  . Heart disease    . Ovarian cancer Maternal Aunt   . Uterine cancer Maternal Aunt   . Hypertension Mother   . Hypertension Maternal Grandmother   . Depression      maternal side  . Anxiety disorder      maternal side   . Diabetes Mother   . Diabetes Maternal Grandfather   . Stroke Maternal Grandfather   . Stroke Maternal Grandmother   . Heart disease Maternal Grandfather   . Heart disease Paternal Grandmother    History  Substance Use Topics  . Smoking status: Never Smoker   . Smokeless tobacco: Not on file  . Alcohol Use: Yes     Comment: occ, under safe drinking limits   OB History   Grav Para Term Preterm Abortions TAB SAB Ect Mult Living                 Review of Systems  Constitutional: Negative for fever and chills.  Gastrointestinal: Positive for abdominal pain and diarrhea. Negative for nausea, vomiting and constipation.  Genitourinary: Negative for dysuria, frequency, vaginal bleeding and vaginal discharge.  Skin: Negative for rash and wound.  Neurological: Negative for headaches.  All other systems reviewed and are negative.     Allergies  Amoxicillin and Dairy aid  Home Medications   Prior to Admission medications   Medication Sig Start Date End Date Taking? Authorizing Provider  ibuprofen (ADVIL,MOTRIN) 200 MG tablet Take 400 mg by mouth every 8 (eight) hours as needed for headache.   Yes Historical Provider, MD  naproxen (NAPROSYN) 500 MG tablet Take 1 tablet (500 mg total) by mouth 2 (two) times daily. 02/12/14   Earle Gell Cartner, PA-C   BP 101/55  Pulse 66  Temp(Src) 98.2 F (36.8 C) (Oral)  Resp 18  SpO2 100%  LMP 02/03/2014 Physical Exam  Nursing note and vitals reviewed. Constitutional: She appears well-developed and well-nourished. No distress.  HENT:  Head: Normocephalic and atraumatic.  Right Ear: External ear normal.  Eyes: Pupils are equal, round, and reactive to light.  Neck: Normal range of motion.   Cardiovascular: Normal rate.   Pulmonary/Chest: Effort normal.  Abdominal: Soft. Bowel sounds are normal. She exhibits no distension. There is tenderness in the right lower quadrant and suprapubic area. There is no rebound and no guarding.  Genitourinary: Vagina normal. Guaiac negative stool. Pelvic exam was performed with patient prone. Uterus is not tender. Right adnexum displays tenderness. Right adnexum displays no mass and no fullness. No tenderness or bleeding around the vagina. No vaginal discharge found.    ED Course  Procedures (including critical care time) Labs Review Labs Reviewed  WET PREP, GENITAL - Abnormal; Notable for the following:    WBC, Wet Prep HPF POC MANY (*)    All other components within normal limits  COMPREHENSIVE METABOLIC PANEL - Abnormal; Notable for the following:    Total Bilirubin <0.2 (*)    All other components within normal limits  URINALYSIS, ROUTINE W REFLEX MICROSCOPIC - Abnormal; Notable for the following:    Hgb urine dipstick TRACE (*)    All other components within normal limits  CBC WITH DIFFERENTIAL  PREGNANCY, URINE  URINE MICROSCOPIC-ADD ON    Imaging Review Ct Abdomen Pelvis W Contrast  02/12/2014   CLINICAL DATA:  Right lower quadrant pain  EXAM: CT ABDOMEN AND PELVIS WITH CONTRAST  TECHNIQUE: Multidetector CT imaging of the abdomen and pelvis was performed using the standard protocol following bolus administration of intravenous contrast.  CONTRAST:  OMNIPAQUE IOHEXOL 300 MG/ML  SOLN  COMPARISON:  None.  FINDINGS: The lung bases are free of acute infiltrate or sizable effusion.  The liver, gallbladder, spleen, adrenal glands a and pancreas are within normal limits. The kidneys are well visualized and demonstrate no mass lesion or obstructive change. No renal calculi are noted.  The appendix is well visualized. Bilateral ovarian cystic changes are noted. No free pelvic fluid is noted. The osseous structures are within normal limits.   IMPRESSION: The appendix is within normal limits.  Bilateral ovarian cystic changes.  No other focal abnormality is seen.   Electronically Signed   By: Alcide Clever M.D.   On: 02/12/2014 07:37     EKG Interpretation None      MDM   Final diagnoses:  Abdominal discomfort in right lower quadrant         Arman Filter, NP 02/12/14 2132

## 2014-02-12 NOTE — Discharge Instructions (Signed)
Abdominal Pain °Many things can cause abdominal pain. Usually, abdominal pain is not caused by a disease and will improve without treatment. It can often be observed and treated at home. Your health care provider will do a physical exam and possibly order blood tests and X-rays to help determine the seriousness of your pain. However, in many cases, more time must pass before a clear cause of the pain can be found. Before that point, your health care provider may not know if you need more testing or further treatment. °HOME CARE INSTRUCTIONS  °Monitor your abdominal pain for any changes. The following actions may help to alleviate any discomfort you are experiencing: °· Only take over-the-counter or prescription medicines as directed by your health care provider. °· Do not take laxatives unless directed to do so by your health care provider. °· Try a clear liquid diet (broth, tea, or water) as directed by your health care provider. Slowly move to a bland diet as tolerated. °SEEK MEDICAL CARE IF: °· You have unexplained abdominal pain. °· You have abdominal pain associated with nausea or diarrhea. °· You have pain when you urinate or have a bowel movement. °· You experience abdominal pain that wakes you in the night. °· You have abdominal pain that is worsened or improved by eating food. °· You have abdominal pain that is worsened with eating fatty foods. °· You have a fever. °SEEK IMMEDIATE MEDICAL CARE IF:  °· Your pain does not go away within 2 hours. °· You keep throwing up (vomiting). °· Your pain is felt only in portions of the abdomen, such as the right side or the left lower portion of the abdomen. °· You pass bloody or black tarry stools. °MAKE SURE YOU: °· Understand these instructions.   °· Will watch your condition.   °· Will get help right away if you are not doing well or get worse.   °Document Released: 03/03/2005 Document Revised: 05/29/2013 Document Reviewed: 01/31/2013 °[employer]® Patient Information  ©2015 [employer]. This information is not intended to replace advice given to you by your health care provider. Make sure you discuss any questions you have with your health care provider. ° °Abdominal Pain, Women °Abdominal (stomach, pelvic, or belly) pain can be caused by many things. It is important to tell your doctor: °· The location of the pain. °· Does it come and go or is it present all the time? °· Are there things that start the pain (eating certain foods, exercise)? °· Are there other symptoms associated with the pain (fever, nausea, vomiting, diarrhea)? °All of this is helpful to know when trying to find the cause of the pain. °CAUSES  °· Stomach: virus or bacteria infection, or ulcer. °· Intestine: appendicitis (inflamed appendix), regional ileitis (Crohn's disease), ulcerative colitis (inflamed colon), irritable bowel syndrome, diverticulitis (inflamed diverticulum of the colon), or cancer of the stomach or intestine. °· Gallbladder disease or stones in the gallbladder. °· Kidney disease, kidney stones, or infection. °· Pancreas infection or cancer. °· Fibromyalgia (pain disorder). °· Diseases of the female organs: °¨ Uterus: fibroid (non-cancerous) tumors or infection. °¨ Fallopian tubes: infection or tubal pregnancy. °¨ Ovary: cysts or tumors. °¨ Pelvic adhesions (scar tissue). °¨ Endometriosis (uterus lining tissue growing in the pelvis and on the pelvic organs). °¨ Pelvic congestion syndrome (female organs filling up with blood just before the menstrual period). °¨ Pain with the menstrual period. °¨ Pain with ovulation (producing an egg). °¨ Pain with an IUD (intrauterine device, birth control) in the uterus. °¨   Cancer of the female organs. °· Functional pain (pain not caused by a disease, may improve without treatment). °· Psychological pain. °· Depression. °DIAGNOSIS  °Your doctor will decide the seriousness of your pain by doing an examination. °· Blood tests. °· X-rays. °· Ultrasound. °· CT  scan (computed tomography, special type of X-ray). °· MRI (magnetic resonance imaging). °· Cultures, for infection. °· Barium enema (dye inserted in the large intestine, to better view it with X-rays). °· Colonoscopy (looking in intestine with a lighted tube). °· Laparoscopy (minor surgery, looking in abdomen with a lighted tube). °· Major abdominal exploratory surgery (looking in abdomen with a large incision). °TREATMENT  °The treatment will depend on the cause of the pain.  °· Many cases can be observed and treated at home. °· Over-the-counter medicines recommended by your caregiver. °· Prescription medicine. °· Antibiotics, for infection. °· Birth control pills, for painful periods or for ovulation pain. °· Hormone treatment, for endometriosis. °· Nerve blocking injections. °· Physical therapy. °· Antidepressants. °· Counseling with a psychologist or psychiatrist. °· Minor or major surgery. °HOME CARE INSTRUCTIONS  °· Do not take laxatives, unless directed by your caregiver. °· Take over-the-counter pain medicine only if ordered by your caregiver. Do not take aspirin because it can cause an upset stomach or bleeding. °· Try a clear liquid diet (broth or water) as ordered by your caregiver. Slowly move to a bland diet, as tolerated, if the pain is related to the stomach or intestine. °· Have a thermometer and take your temperature several times a day, and record it. °· Bed rest and sleep, if it helps the pain. °· Avoid sexual intercourse, if it causes pain. °· Avoid stressful situations. °· Keep your follow-up appointments and tests, as your caregiver orders. °· If the pain does not go away with medicine or surgery, you may try: °¨ Acupuncture. °¨ Relaxation exercises (yoga, meditation). °¨ Group therapy. °¨ Counseling. °SEEK MEDICAL CARE IF:  °· You notice certain foods cause stomach pain. °· Your home care treatment is not helping your pain. °· You need stronger pain medicine. °· You want your IUD  removed. °· You feel faint or lightheaded. °· You develop nausea and vomiting. °· You develop a rash. °· You are having side effects or an allergy to your medicine. °SEEK IMMEDIATE MEDICAL CARE IF:  °· Your pain does not go away or gets worse. °· You have a fever. °· Your pain is felt only in portions of the abdomen. The right side could possibly be appendicitis. The left lower portion of the abdomen could be colitis or diverticulitis. °· You are passing blood in your stools (bright red or black tarry stools, with or without vomiting). °· You have blood in your urine. °· You develop chills, with or without a fever. °· You pass out. °MAKE SURE YOU:  °· Understand these instructions. °· Will watch your condition. °· Will get help right away if you are not doing well or get worse. °Document Released: 03/21/2007 Document Revised: 10/08/2013 Document Reviewed: 04/10/2009 °Exit[employer]® Patient Information ©2015 [employer]. This information is not intended to replace advice given to you by your health care provider. Make sure you discuss any questions you have with your health care provider. ° °

## 2014-02-20 NOTE — ED Provider Notes (Signed)
Medical screening examination/treatment/procedure(s) were performed by non-physician practitioner and as supervising physician I was immediately available for consultation/collaboration.   EKG Interpretation None        Rolland Porter, MD 02/20/14 [phone]

## 2014-09-11 ENCOUNTER — Other Ambulatory Visit: Payer: Self-pay

## 2014-09-12 LAB — CYTOLOGY - PAP

## 2016-10-29 ENCOUNTER — Other Ambulatory Visit: Payer: Self-pay | Admitting: General Surgery

## 2016-10-29 DIAGNOSIS — R0789 Other chest pain: Secondary | ICD-10-CM

## 2016-11-07 ENCOUNTER — Ambulatory Visit (HOSPITAL_BASED_OUTPATIENT_CLINIC_OR_DEPARTMENT_OTHER)
Admit: 2016-11-07 | Discharge: 2016-11-07 | Disposition: A | Discharge status: Home / Self Care | Payer: BLUE CROSS/BLUE SHIELD | Source: Ambulatory Visit | Attending: Emergency Medicine | Admitting: Emergency Medicine

## 2016-11-07 ENCOUNTER — Other Ambulatory Visit (HOSPITAL_BASED_OUTPATIENT_CLINIC_OR_DEPARTMENT_OTHER): Payer: BLUE CROSS/BLUE SHIELD

## 2016-11-07 ENCOUNTER — Emergency Department (HOSPITAL_BASED_OUTPATIENT_CLINIC_OR_DEPARTMENT_OTHER)
Admission: EM | Admit: 2016-11-07 | Discharge: 2016-11-07 | Disposition: A | Discharge status: Home / Self Care | Payer: BLUE CROSS/BLUE SHIELD | Attending: Emergency Medicine | Admitting: Emergency Medicine

## 2016-11-07 ENCOUNTER — Encounter (HOSPITAL_BASED_OUTPATIENT_CLINIC_OR_DEPARTMENT_OTHER): Payer: Self-pay | Admitting: *Deleted

## 2016-11-07 DIAGNOSIS — Z975 Presence of (intrauterine) contraceptive device: Secondary | ICD-10-CM | POA: Insufficient documentation

## 2016-11-07 DIAGNOSIS — J45909 Unspecified asthma, uncomplicated: Secondary | ICD-10-CM | POA: Insufficient documentation

## 2016-11-07 DIAGNOSIS — R102 Pelvic and perineal pain: Secondary | ICD-10-CM

## 2016-11-07 DIAGNOSIS — R109 Unspecified abdominal pain: Secondary | ICD-10-CM | POA: Diagnosis present

## 2016-11-07 DIAGNOSIS — T839XXA Unspecified complication of genitourinary prosthetic device, implant and graft, initial encounter: Secondary | ICD-10-CM

## 2016-11-07 LAB — URINALYSIS, MICROSCOPIC (REFLEX)

## 2016-11-07 LAB — URINALYSIS, ROUTINE W REFLEX MICROSCOPIC
Bilirubin Urine: NEGATIVE
Glucose, UA: NEGATIVE mg/dL
KETONES UR: NEGATIVE mg/dL
Leukocytes, UA: NEGATIVE
Nitrite: NEGATIVE
PROTEIN: NEGATIVE mg/dL
Specific Gravity, Urine: 1.006 (ref 1.005–1.030)
pH: 6 (ref 5.0–8.0)

## 2016-11-07 LAB — WET PREP, GENITAL
CLUE CELLS WET PREP: NONE SEEN
SPERM: NONE SEEN
TRICH WET PREP: NONE SEEN
Yeast Wet Prep HPF POC: NONE SEEN

## 2016-11-07 LAB — PREGNANCY, URINE: Preg Test, Ur: NEGATIVE

## 2016-11-07 MED ORDER — OXYCODONE-ACETAMINOPHEN 5-325 MG PO TABS: 1.0000 | ORAL_TABLET | ORAL | 0 refills | Status: AC | PRN

## 2016-11-07 MED ORDER — OXYCODONE-ACETAMINOPHEN 5-325 MG PO TABS
1.0000 | ORAL_TABLET | Freq: Once | ORAL | Status: AC
  Administered 2016-11-07: 1 via ORAL
  Filled 2016-11-07: qty 1

## 2016-11-07 NOTE — ED Triage Notes (Signed)
Pt c/o lower abd pain that started 5 hours ago. States she had an IUD placed a couple weeks ago and has experienced some cramping, but nothing to this extent. States pain comes and goes and describes as a strong labor "cramping" type pain. Denies any vaginal d/c. States small amount of vaginal spoting since IUD placed. Denies any fevers/chills.

## 2016-11-07 NOTE — ED Notes (Signed)
Pt aware to return for her u/s at 10am per radiology

## 2016-11-07 NOTE — ED Provider Notes (Signed)
[facility] Provider Note: [name], MD, FACEP  CSN: [id number] MRN: [id number] ARRIVAL: 11/07/16 at 0316 ROOM: [facility]   CHIEF COMPLAINT  Abdominal Pain   HISTORY OF PRESENT ILLNESS  Diane Wheeler is a 31 y.o. female who had an IUD placed 2 weeks ago. She has had some spotting and occasional mild cramping since then. About 5 hours ago she developed severe cramping. She describes a cramping as being low in the midline of her pelvis. She rates it as a 9 out of 10 at its worst, and essentially painless in between. Initially the cramps for about 2 minutes in length but have shortened and are now 1/2-1 minute in length. Periodicity has been irregular. Activity seems to make him somewhat worse. She continues to have vaginal spotting but no heavy bleeding. She has seen no vaginal discharge. She denies fever, chills, nausea, vomiting or diarrhea. She took 800 milligrams of ibuprofen prior to arrival which may be why her symptoms have improved some.   Past Medical History:  Diagnosis Date  . Asthma    as a child, occ flares when runs outside  . Chicken pox   . Headache(784.0)    frequent   . Migraines   . Palpitations     Past Surgical History:  Procedure Laterality Date  . NO PAST SURGERIES      Family History  Problem Relation Age of Onset  . Alcohol abuse Other        father's side of family  . Arthritis Maternal Grandfather   . Diabetes Maternal Grandfather   . Stroke Maternal Grandfather   . Heart disease Maternal Grandfather   . Arthritis Maternal Grandmother        also htn, copd, afib, stroke  . Hypertension Maternal Grandmother   . Stroke Maternal Grandmother   . Heart disease Unknown   . Ovarian cancer Maternal Aunt   . Uterine cancer Maternal Aunt   . Hypertension Mother   . Diabetes Mother   . Depression Unknown        maternal side  . Anxiety disorder Unknown        maternal side   . Heart disease Paternal Grandmother     Social  History  Substance Use Topics  . Smoking status: Never Smoker  . Smokeless tobacco: Never Used  . Alcohol use Yes     Comment: occ, under safe drinking limits    Prior to Admission medications   Medication Sig Start Date End Date Taking? Authorizing Provider  ibuprofen (ADVIL,MOTRIN) 200 MG tablet Take 400 mg by mouth every 8 (eight) hours as needed for headache.    [provider]  naproxen (NAPROSYN) 500 MG tablet Take 1 tablet (500 mg total) by mouth 2 (two) times daily. 02/12/14   Joycie Peek, PA-C    Allergies Amoxicillin and Dairy aid [lactase]   REVIEW OF SYSTEMS  Negative except as noted here or in the History of Present Illness.   PHYSICAL EXAMINATION  Initial Vital Signs Blood pressure (!) 128/92, pulse 86, temperature 98.9 F (37.2 C), temperature source Oral, resp. rate 18, height 5\' 6"  (1.676 m), weight 104.3 kg (230 lb), last menstrual period 10/24/2016, SpO2 99 %.  Examination General: Well-developed, well-nourished female in no acute distress; appearance consistent with age of record HENT: normocephalic; atraumatic Eyes: pupils equal, round and reactive to light; extraocular muscles intact Neck: supple Heart: regular rate and rhythm Lungs: clear to auscultation bilaterally Abdomen: soft; nondistended; nontender; no masses or  hepatosplenomegaly; bowel sounds present GU: Normal external genitalia; a few dark clots in vagina; slight bleeding per cervical os; no purulent discharge seen; no cervical motion tenderness; IUD strings seen through the cervical os; no adnexal tenderness Extremities: No deformity; full range of motion; pulses normal Neurologic: Awake, alert and oriented; motor function intact in all extremities and symmetric; no facial droop Skin: Warm and dry Psychiatric: Normal mood and affect   RESULTS  Summary of this visit's results, reviewed by myself:   EKG Interpretation  Date/Time:    Ventricular Rate:    PR Interval:    QRS  Duration:   QT Interval:    QTC Calculation:   R Axis:     Text Interpretation:        Laboratory Studies: Results for orders placed or performed during the hospital encounter of 11/07/16 (from the past 24 hour(s))  Urinalysis, Routine w reflex microscopic     Status: Abnormal   Collection Time: 11/07/16  3:24 AM  Result Value Ref Range   Color, Urine YELLOW YELLOW   APPearance CLOUDY (A) CLEAR   Specific Gravity, Urine 1.006 1.005 - 1.030   pH 6.0 5.0 - 8.0   Glucose, UA NEGATIVE NEGATIVE mg/dL   Hgb urine dipstick LARGE (A) NEGATIVE   Bilirubin Urine NEGATIVE NEGATIVE   Ketones, ur NEGATIVE NEGATIVE mg/dL   Protein, ur NEGATIVE NEGATIVE mg/dL   Nitrite NEGATIVE NEGATIVE   Leukocytes, UA NEGATIVE NEGATIVE  Pregnancy, urine     Status: None   Collection Time: 11/07/16  3:24 AM  Result Value Ref Range   Preg Test, Ur NEGATIVE NEGATIVE  Urinalysis, Microscopic (reflex)     Status: Abnormal   Collection Time: 11/07/16  3:24 AM  Result Value Ref Range   RBC / HPF 0-5 0 - 5 RBC/hpf   WBC, UA 0-5 0 - 5 WBC/hpf   Bacteria, UA MANY (A) NONE SEEN   Squamous Epithelial / LPF 6-30 (A) NONE SEEN  Wet prep, genital     Status: Abnormal   Collection Time: 11/07/16  4:01 AM  Result Value Ref Range   Yeast Wet Prep HPF POC NONE SEEN NONE SEEN   Trich, Wet Prep NONE SEEN NONE SEEN   Clue Cells Wet Prep HPF POC NONE SEEN NONE SEEN   WBC, Wet Prep HPF POC MANY (A) NONE SEEN   Sperm NONE SEEN    Imaging Studies: No results found.  ED COURSE  Nursing notes and initial vitals signs, including pulse oximetry, reviewed.  Vitals:   11/07/16 0321  BP: (!) 128/92  Pulse: 86  Resp: 18  Temp: 98.9 F (37.2 C)  TempSrc: Oral  SpO2: 99%  Weight: 104.3 kg (230 lb)  Height: 5\' 6"  (1.676 m)   4:13 AM Doubt infectious etiology as pain is not constant, uterus is nontender and patient does not appear clinically ill. We will have her return for pelvic ultrasound to evaluate placement of the  IUD.   PROCEDURES    ED DIAGNOSES     ICD-9-CM ICD-10-CM   1. Pelvic cramping 625.9 R10.2   2. Symptom related to intrauterine device (IUD), initial encounter ([facility]) 996.76 T83.9XXA        Hermon Zea, Jonny RuizJohn, MD 11/07/16 [phone]

## 2016-11-07 NOTE — ED Provider Notes (Signed)
Pt notified in person of pelvic ultrasound results this morning after ultrasound completed.  IUD in place.     Jerelyn ScottLinker, Martha, MD 11/07/16 [phone]

## 2016-11-08 LAB — GC/CHLAMYDIA PROBE AMP (~~LOC~~) NOT AT ARMC
CHLAMYDIA, DNA PROBE: NEGATIVE
NEISSERIA GONORRHEA: NEGATIVE

## 2016-11-10 ENCOUNTER — Ambulatory Visit
Admission: RE | Admit: 2016-11-10 | Discharge: 2016-11-10 | Disposition: A | Discharge status: Home / Self Care | Payer: BLUE CROSS/BLUE SHIELD | Source: Ambulatory Visit | Attending: General Surgery | Admitting: General Surgery

## 2016-11-10 DIAGNOSIS — R0789 Other chest pain: Secondary | ICD-10-CM

## 2016-11-10 MED ORDER — IOPAMIDOL (ISOVUE-300) INJECTION 61%
75.0000 mL | Freq: Once | INTRAVENOUS | Status: AC | PRN
  Administered 2016-11-10: 75 mL via INTRAVENOUS

## 2018-10-09 ENCOUNTER — Other Ambulatory Visit: Payer: Self-pay | Admitting: Family Medicine

## 2018-10-09 DIAGNOSIS — R234 Changes in skin texture: Secondary | ICD-10-CM

## 2018-10-12 ENCOUNTER — Other Ambulatory Visit: Payer: Self-pay | Admitting: Advanced Practice Midwife

## 2018-10-12 DIAGNOSIS — R234 Changes in skin texture: Secondary | ICD-10-CM

## 2018-10-17 ENCOUNTER — Other Ambulatory Visit (HOSPITAL_COMMUNITY): Payer: Self-pay | Admitting: *Deleted

## 2018-10-17 DIAGNOSIS — N6310 Unspecified lump in the right breast, unspecified quadrant: Secondary | ICD-10-CM

## 2018-10-26 ENCOUNTER — Encounter (HOSPITAL_COMMUNITY): Payer: Self-pay

## 2018-10-26 ENCOUNTER — Ambulatory Visit (HOSPITAL_COMMUNITY)
Admission: RE | Admit: 2018-10-26 | Discharge: 2018-10-26 | Disposition: A | Discharge status: Home / Self Care | Payer: Self-pay | Source: Ambulatory Visit | Attending: Obstetrics and Gynecology | Admitting: Obstetrics and Gynecology

## 2018-10-26 ENCOUNTER — Other Ambulatory Visit: Payer: Self-pay

## 2018-10-26 VITALS — BP 116/72 | Temp 98.0°F | Wt 212.0 lb

## 2018-10-26 DIAGNOSIS — N644 Mastodynia: Secondary | ICD-10-CM

## 2018-10-26 DIAGNOSIS — Z1239 Encounter for other screening for malignant neoplasm of breast: Secondary | ICD-10-CM

## 2018-10-26 NOTE — Patient Instructions (Signed)
Explained breast self awareness with Mertie Clause. Patient did not need a Pap smear today due to last Pap smear was in March 2018 per patient. Let her know [facility] will cover Pap smears every 3 years unless has a history of abnormal Pap smears. Referred patient to the [facility] for a diagnostic mammogram. Appointment scheduled for Tuesday, Oct 31, 2018 at 1320. Patient aware of appointment and will be there. Sevanna Litzau verbalized understanding.  Jelisha Weed, Kathaleen Maser, RN 12:33 PM

## 2018-10-26 NOTE — Progress Notes (Signed)
Complaints of right breast lump x 3 weeks and bilateral lower breast pain. Patient states the pain comes and goes. Patient rates the pain at a 3-4 out of 10.  Pap Smear: Pap smear not completed today. Last Pap smear was in March 2018 at [facility] and normal per patient. Per patient has no history of an abnormal Pap smear. Last Pap smear results is not in Epic.Previous Pap smear result from 09/11/2014 is in Epic.  Physical exam: Breasts Right breast slightly larger than left. No skin abnormalities bilateral breasts. No nipple retraction bilateral breasts. No nipple discharge bilateral breasts. No lymphadenopathy. No lumps palpated bilateral breasts. Unable to palpate a lump in patients area of concern. Complaints of left lower and right outer breast pain on exam. Referred patient to the [facility] for a diagnostic mammogram. Appointment scheduled for Tuesday, Oct 31, 2018 at 1320.        Pelvic/Bimanual No Pap smear completed today since last Pap smear was in March 2018 per patient. Pap smear not indicated per [facility] guidelines.   Smoking History: Patient has never smoked.  Patient Navigation: Patient education provided. Access to services provided for patient through [facility] program.   Breast and Cervical Cancer Risk Assessment: Patient has no family history of breast cancer, known genetic mutations, or radiation treatment to the chest before age 65. Patient has no history of cervical dysplasia, immunocompromised, or DES exposure in-utero. Breast cancer risk assessment completed. No breast cancer risk calculated due to patient is less than 80 years old.

## 2018-10-31 ENCOUNTER — Ambulatory Visit
Admission: RE | Admit: 2018-10-31 | Discharge: 2018-10-31 | Disposition: A | Discharge status: Home / Self Care | Payer: No Typology Code available for payment source | Source: Ambulatory Visit | Attending: Obstetrics and Gynecology | Admitting: Obstetrics and Gynecology

## 2018-10-31 ENCOUNTER — Encounter (HOSPITAL_COMMUNITY): Payer: Self-pay | Admitting: *Deleted

## 2018-10-31 ENCOUNTER — Other Ambulatory Visit: Payer: Self-pay

## 2018-10-31 ENCOUNTER — Ambulatory Visit
Admission: RE | Admit: 2018-10-31 | Discharge: 2018-10-31 | Disposition: A | Discharge status: Home / Self Care | Payer: BLUE CROSS/BLUE SHIELD | Source: Ambulatory Visit | Attending: Obstetrics and Gynecology | Admitting: Obstetrics and Gynecology

## 2018-10-31 DIAGNOSIS — N6310 Unspecified lump in the right breast, unspecified quadrant: Secondary | ICD-10-CM

## 2019-05-08 ENCOUNTER — Other Ambulatory Visit: Payer: Self-pay | Admitting: Internal Medicine

## 2019-05-08 DIAGNOSIS — N852 Hypertrophy of uterus: Secondary | ICD-10-CM

## 2019-05-16 ENCOUNTER — Other Ambulatory Visit: Payer: No Typology Code available for payment source

## 2019-06-11 ENCOUNTER — Ambulatory Visit
Admission: RE | Admit: 2019-06-11 | Discharge: 2019-06-11 | Disposition: A | Discharge status: Home / Self Care | Payer: No Typology Code available for payment source | Source: Ambulatory Visit | Attending: Internal Medicine | Admitting: Internal Medicine

## 2019-06-11 DIAGNOSIS — N852 Hypertrophy of uterus: Secondary | ICD-10-CM

## 2020-08-21 ENCOUNTER — Encounter (HOSPITAL_BASED_OUTPATIENT_CLINIC_OR_DEPARTMENT_OTHER): Payer: Self-pay | Admitting: *Deleted

## 2020-08-21 ENCOUNTER — Emergency Department (HOSPITAL_BASED_OUTPATIENT_CLINIC_OR_DEPARTMENT_OTHER)
Admission: EM | Admit: 2020-08-21 | Discharge: 2020-08-21 | Disposition: A | Discharge status: Home / Self Care | Payer: No Typology Code available for payment source | Attending: Emergency Medicine | Admitting: Emergency Medicine

## 2020-08-21 ENCOUNTER — Other Ambulatory Visit: Payer: Self-pay

## 2020-08-21 ENCOUNTER — Emergency Department (HOSPITAL_BASED_OUTPATIENT_CLINIC_OR_DEPARTMENT_OTHER): Payer: No Typology Code available for payment source

## 2020-08-21 DIAGNOSIS — R197 Diarrhea, unspecified: Secondary | ICD-10-CM | POA: Insufficient documentation

## 2020-08-21 DIAGNOSIS — R079 Chest pain, unspecified: Secondary | ICD-10-CM

## 2020-08-21 DIAGNOSIS — F17210 Nicotine dependence, cigarettes, uncomplicated: Secondary | ICD-10-CM | POA: Insufficient documentation

## 2020-08-21 DIAGNOSIS — R1010 Upper abdominal pain, unspecified: Secondary | ICD-10-CM | POA: Insufficient documentation

## 2020-08-21 DIAGNOSIS — J45909 Unspecified asthma, uncomplicated: Secondary | ICD-10-CM | POA: Insufficient documentation

## 2020-08-21 DIAGNOSIS — R202 Paresthesia of skin: Secondary | ICD-10-CM | POA: Insufficient documentation

## 2020-08-21 LAB — BASIC METABOLIC PANEL
Anion gap: 10 (ref 5–15)
BUN: 7 mg/dL (ref 6–20)
CO2: 25 mmol/L (ref 22–32)
Calcium: 9.5 mg/dL (ref 8.9–10.3)
Chloride: 104 mmol/L (ref 98–111)
Creatinine, Ser: 0.61 mg/dL (ref 0.44–1.00)
GFR, Estimated: >60 mL/min (ref >60)
Glucose, Bld: 96 mg/dL (ref 70–99)
Potassium: 4.3 mmol/L (ref 3.5–5.1)
Sodium: 139 mmol/L (ref 135–145)

## 2020-08-21 LAB — CBC
HCT: 38.3 % (ref 36.0–46.0)
Hemoglobin: 12.8 g/dL (ref 12.0–15.0)
MCH: 28.9 pg (ref 26.0–34.0)
MCHC: 33.4 g/dL (ref 30.0–36.0)
MCV: 86.5 fL (ref 80.0–100.0)
Platelets: 337 10*3/uL (ref 150–400)
RBC: 4.43 MIL/uL (ref 3.87–5.11)
RDW: 13 % (ref 11.5–15.5)
WBC: 10.6 10*3/uL — ABNORMAL HIGH (ref 4.0–10.5)
nRBC: 0 % (ref 0.0–0.2)

## 2020-08-21 LAB — PREGNANCY, URINE: Preg Test, Ur: NEGATIVE

## 2020-08-21 LAB — TROPONIN I (HIGH SENSITIVITY)
Troponin I (High Sensitivity): <2 ng/L (ref <18)
Troponin I (High Sensitivity): 4 ng/L (ref <18)

## 2020-08-21 NOTE — ED Notes (Signed)
ED Provider at bedside. 

## 2020-08-21 NOTE — ED Triage Notes (Signed)
Sharp stabbing chest pain in his left back. Her legs and arms were tingling. Normal on arrival. Hx of back pain for the past month. She has a MD she is seeing for chest and back pain. She was diagnosed with anxiety but did not do well with the medications she was given to treat the anxiety.

## 2020-08-21 NOTE — ED Provider Notes (Signed)
[facility] EMERGENCY DEPARTMENT Provider Note   CSN: [id number] Arrival date & time: 08/21/20  1747     History Chief Complaint  Patient presents with  . Chest Pain    Diane Wheeler is a 35 y.o. female.  HPI Patient is a 35 year old female history of migraines, chronic back pain, anxiety, who presents the emergency department due to chest and back pain.  Patient states that she has been out of work for the past month due to her chronic back pain.  She was diagnosed with anxiety and started on buspirone without relief.  She discontinue this medication.  She states that her back pain worsened today and became sharp.  After this occurred she then began experiencing upper abdominal pain that she describes as burning and radiated up her chest to her left shoulder.  This quickly subsided.  She also states that she was having some mild tingling in all 4 of her extremities which has since subsided as well.  She presents the emergency department today for further evaluation.  She reports continued back pain at this time but otherwise has no other complaints.  She was initially nauseous but did not vomit.  No current nausea.  Also reports one episode of diarrhea.    Past Medical History:  Diagnosis Date  . Asthma    as a child, occ flares when runs outside  . Chicken pox   . Headache(784.0)    frequent   . Migraines   . Palpitations     Patient Active Problem List   Diagnosis Date Noted  . Palpitations 08/13/2013  . Abnormal PFTs 09/08/2012  . Allergic rhinitis 08/03/2012  . Dyspnea 08/03/2012  . Chronic cough 08/03/2012    Past Surgical History:  Procedure Laterality Date  . NO PAST SURGERIES       OB History    Gravida  0   Para  0   Term  0   Preterm  0   AB  0   Living  0     SAB  0   IAB  0   Ectopic  0   Multiple  0   Live Births  0           Family History  Problem Relation Age of Onset  . Alcohol abuse Other        father's side  of family  . Arthritis Maternal Grandfather   . Diabetes Maternal Grandfather   . Stroke Maternal Grandfather   . Heart disease Maternal Grandfather   . Arthritis Maternal Grandmother        also htn, copd, afib, stroke  . Hypertension Maternal Grandmother   . Stroke Maternal Grandmother   . Heart disease Other   . Ovarian cancer Maternal Aunt        uterine  . Hypertension Mother   . Diabetes Mother   . Depression Other        maternal side  . Anxiety disorder Other        maternal side   . Heart disease Paternal Grandmother     Social History   Tobacco Use  . Smoking status: Never Smoker  . Smokeless tobacco: Never Used  Substance Use Topics  . Alcohol use: Yes    Comment: occ, under safe drinking limits  . Drug use: No    Home Medications Prior to Admission medications   Medication Sig Start Date End Date Taking? Authorizing Provider  ergocalciferol (VITAMIN D2) 1.25 MG (50000  UT) capsule Take 1 capsule by mouth once a week. 08/07/20  Yes [provider]  ketoconazole (NIZORAL) 2 % cream Apply 1 application topically daily.    [provider]  oxyCODONE-acetaminophen (PERCOCET) 5-325 MG tablet Take 1 tablet by mouth every 4 (four) hours as needed (for pain). Patient not taking: No sig reported 11/07/16   Molpus, Jonny Ruiz, MD    Allergies    Amoxicillin, Dairy aid [lactase], and Percocet [oxycodone-acetaminophen]  Review of Systems   Review of Systems  All other systems reviewed and are negative. Ten systems reviewed and are negative for acute change, except as noted in the HPI.   Physical Exam Updated Vital Signs BP 101/68   Pulse 67   Temp 98.6 F (37 C) (Oral)   Resp 14   Ht 5\' 6"  (1.676 m)   Wt 103.9 kg   LMP 07/24/2020   SpO2 100%   BMI 36.96 kg/m   Physical Exam Vitals and nursing note reviewed.  Constitutional:      General: She is not in acute distress.    Appearance: Normal appearance. She is well-developed. She is not  ill-appearing, toxic-appearing or diaphoretic.  HENT:     Head: Normocephalic and atraumatic.     Right Ear: External ear normal.     Left Ear: External ear normal.     Nose: Nose normal.     Mouth/Throat:     Mouth: Mucous membranes are moist.     Pharynx: Oropharynx is clear. No oropharyngeal exudate or posterior oropharyngeal erythema.  Eyes:     Extraocular Movements: Extraocular movements intact.  Cardiovascular:     Rate and Rhythm: Normal rate and regular rhythm.     Pulses: Normal pulses.          Radial pulses are 2+ on the right side and 2+ on the left side.     Heart sounds: Normal heart sounds. Heart sounds not distant. No murmur heard.  No systolic murmur is present.  No diastolic murmur is present. No friction rub. No gallop.   Pulmonary:     Effort: Pulmonary effort is normal. No tachypnea, accessory muscle usage or respiratory distress.     Breath sounds: Normal breath sounds. No stridor. No decreased breath sounds, wheezing, rhonchi or rales.  Chest:     Chest wall: No tenderness.     Comments: No anterior chest wall tenderness. Abdominal:     General: Abdomen is flat.     Palpations: Abdomen is soft.     Tenderness: There is abdominal tenderness.     Comments: Abdomen is soft.  Very mild tenderness noted along the periumbilical region.  Otherwise no regions of abdominal tenderness.  Musculoskeletal:        General: Normal range of motion.     Cervical back: Normal range of motion and neck supple. No tenderness.     Right lower leg: No tenderness. No edema.     Left lower leg: No tenderness. No edema.     Comments: No leg swelling or calf tenderness.  No palpable neck or back pain.  No midline spine pain.  Skin:    General: Skin is warm and dry.  Neurological:     General: No focal deficit present.     Mental Status: She is alert and oriented to person, place, and time.  Psychiatric:        Mood and Affect: Mood normal.        Behavior: Behavior normal.  ED Results / Procedures / Treatments   Labs (all labs ordered are listed, but only abnormal results are displayed) Labs Reviewed  CBC - Abnormal; Notable for the following components:      Result Value   WBC 10.6 (*)    All other components within normal limits  BASIC METABOLIC PANEL  PREGNANCY, URINE  TROPONIN I (HIGH SENSITIVITY)  TROPONIN I (HIGH SENSITIVITY)   EKG EKG Interpretation  Date/Time:  Thursday August 21 2020 17:57:39 EDT Ventricular Rate:  97 PR Interval:  148 QRS Duration: 78 QT Interval:  322 QTC Calculation: 408 R Axis:   49 Text Interpretation: Normal sinus rhythm with sinus arrhythmia Nonspecific T wave abnormality Abnormal ECG No significant change since last tracing Confirmed by Vanetta Mulders [phone]) on 08/21/2020 6:05:28 PM   Radiology DG Chest 2 View  Result Date: 08/21/2020 CLINICAL DATA:  Chest pain EXAM: CHEST - 2 VIEW COMPARISON:  07/12/2013 FINDINGS: The heart size and mediastinal contours are within normal limits. Both lungs are clear. The visualized skeletal structures are unremarkable. IMPRESSION: No active cardiopulmonary disease. Electronically Signed   By: Jasmine Pang M.D.   On: 08/21/2020 18:40    Procedures Procedures   Medications Ordered in ED Medications - No data to display  ED Course  I have reviewed the triage vital signs and the nursing notes.  Pertinent labs & imaging results that were available during my care of the patient were reviewed by me and considered in my medical decision making (see chart for details).    MDM Rules/Calculators/A&P                          Pt is a 35 y.o. female who presents the emergency department due to chest pain as well as back pain.  Labs: CBC with a white count of 10.6. BMP without abnormalities. Pregnancy test negative. Troponin less than 2.  Imaging: Chest x-ray is negative.  ECG: No significant change since last tracing.  I, Placido Sou, PA-C, personally reviewed  and evaluated these images and lab results as part of my medical decision-making.  Unsure of the cause of patient's symptoms.  She has been experiencing chronic pain in the back for many weeks.  No palpable pain on my exam.  She was diagnosed with anxiety but could not tolerate buspirone.  Feel that her symptoms today might be secondary to anxiety.  She had a reassuring work-up.  Troponin less than 2.  No significant changes on her ECG.  She is PERC negative.  Heart score of 2.  Doubt ACS or PE.  Discussed these findings in length with the patient.  She is planning on following up with her PCP.  Discussed return precautions.  Her questions were answered and she was amicable at the time of discharge.  Note: Portions of this report may have been transcribed using voice recognition software. Every effort was made to ensure accuracy; however, inadvertent computerized transcription errors may be present.   Final Clinical Impression(s) / ED Diagnoses Final diagnoses:  Chest pain, unspecified type    Rx / DC Orders ED Discharge Orders    None       Placido Sou, PA-C 08/21/20 2128    Vanetta Mulders, MD 08/27/20 [phone]

## 2020-08-21 NOTE — Discharge Instructions (Addendum)
Please follow-up with your regular doctor regarding your symptoms.  If they worsen, please do not hesitate to return to the emergency department for reevaluation.  It was a pleasure to meet you.

## 2020-10-09 ENCOUNTER — Emergency Department (HOSPITAL_BASED_OUTPATIENT_CLINIC_OR_DEPARTMENT_OTHER): Payer: No Typology Code available for payment source

## 2020-10-09 ENCOUNTER — Other Ambulatory Visit: Payer: Self-pay

## 2020-10-09 ENCOUNTER — Emergency Department (HOSPITAL_BASED_OUTPATIENT_CLINIC_OR_DEPARTMENT_OTHER)
Admission: EM | Admit: 2020-10-09 | Discharge: 2020-10-09 | Disposition: A | Discharge status: Home / Self Care | Payer: No Typology Code available for payment source | Attending: Emergency Medicine | Admitting: Emergency Medicine

## 2020-10-09 DIAGNOSIS — J45909 Unspecified asthma, uncomplicated: Secondary | ICD-10-CM | POA: Insufficient documentation

## 2020-10-09 DIAGNOSIS — R002 Palpitations: Secondary | ICD-10-CM | POA: Insufficient documentation

## 2020-10-09 DIAGNOSIS — M549 Dorsalgia, unspecified: Secondary | ICD-10-CM | POA: Insufficient documentation

## 2020-10-09 DIAGNOSIS — R0602 Shortness of breath: Secondary | ICD-10-CM | POA: Insufficient documentation

## 2020-10-09 DIAGNOSIS — R102 Pelvic and perineal pain: Secondary | ICD-10-CM | POA: Insufficient documentation

## 2020-10-09 DIAGNOSIS — R0789 Other chest pain: Secondary | ICD-10-CM | POA: Insufficient documentation

## 2020-10-09 DIAGNOSIS — D72829 Elevated white blood cell count, unspecified: Secondary | ICD-10-CM | POA: Insufficient documentation

## 2020-10-09 LAB — CBC WITH DIFFERENTIAL/PLATELET
Abs Immature Granulocytes: 0.02 10*3/uL (ref 0.00–0.07)
Basophils Absolute: 0 10*3/uL (ref 0.0–0.1)
Basophils Relative: 0 %
Eosinophils Absolute: 0.1 10*3/uL (ref 0.0–0.5)
Eosinophils Relative: 1 %
HCT: 37.1 % (ref 36.0–46.0)
Hemoglobin: 12.5 g/dL (ref 12.0–15.0)
Immature Granulocytes: 0 %
Lymphocytes Relative: 37 %
Lymphs Abs: 3.6 10*3/uL (ref 0.7–4.0)
MCH: 29 pg (ref 26.0–34.0)
MCHC: 33.7 g/dL (ref 30.0–36.0)
MCV: 86.1 fL (ref 80.0–100.0)
Monocytes Absolute: 0.5 10*3/uL (ref 0.1–1.0)
Monocytes Relative: 5 %
Neutro Abs: 5.6 10*3/uL (ref 1.7–7.7)
Neutrophils Relative %: 57 %
Platelets: 331 10*3/uL (ref 150–400)
RBC: 4.31 MIL/uL (ref 3.87–5.11)
RDW: 13.2 % (ref 11.5–15.5)
WBC: 9.9 10*3/uL (ref 4.0–10.5)
nRBC: 0 % (ref 0.0–0.2)

## 2020-10-09 LAB — COMPREHENSIVE METABOLIC PANEL
ALT: 13 U/L (ref 0–44)
AST: 28 U/L (ref 15–41)
Albumin: 4.3 g/dL (ref 3.5–5.0)
Alkaline Phosphatase: 39 U/L (ref 38–126)
Anion gap: 8 (ref 5–15)
BUN: 8 mg/dL (ref 6–20)
CO2: 22 mmol/L (ref 22–32)
Calcium: 9.2 mg/dL (ref 8.9–10.3)
Chloride: 107 mmol/L (ref 98–111)
Creatinine, Ser: 0.71 mg/dL (ref 0.44–1.00)
GFR, Estimated: >60 mL/min (ref >60)
Glucose, Bld: 94 mg/dL (ref 70–99)
Potassium: 5 mmol/L (ref 3.5–5.1)
Sodium: 137 mmol/L (ref 135–145)
Total Bilirubin: 0.8 mg/dL (ref 0.3–1.2)
Total Protein: 7.3 g/dL (ref 6.5–8.1)

## 2020-10-09 LAB — URINALYSIS, ROUTINE W REFLEX MICROSCOPIC
Bilirubin Urine: NEGATIVE
Glucose, UA: NEGATIVE mg/dL
Ketones, ur: NEGATIVE mg/dL
Nitrite: NEGATIVE
Protein, ur: NEGATIVE mg/dL
Specific Gravity, Urine: 1.01 (ref 1.005–1.030)
pH: 6.5 (ref 5.0–8.0)

## 2020-10-09 LAB — URINALYSIS, MICROSCOPIC (REFLEX)

## 2020-10-09 LAB — HCG, QUANTITATIVE, PREGNANCY: hCG, Beta Chain, Quant, S: <1 m[IU]/mL (ref <5)

## 2020-10-09 LAB — LIPASE, BLOOD: Lipase: 23 U/L (ref 11–51)

## 2020-10-09 MED ORDER — NAPROXEN 500 MG PO TABS: 500.0000 mg | ORAL_TABLET | Freq: Two times a day (BID) | ORAL | 0 refills | Status: AC

## 2020-10-09 MED ORDER — KETOROLAC TROMETHAMINE 15 MG/ML IJ SOLN
15.0000 mg | Freq: Once | INTRAMUSCULAR | Status: AC
  Administered 2020-10-09: 15 mg via INTRAVENOUS
  Filled 2020-10-09: qty 1

## 2020-10-09 NOTE — ED Triage Notes (Signed)
Pelvic pain for two months  Menses 4/9 to the 13th Last month  Uses condoms  GYN appointment is next week - was advised to come to the ED Today CP last night and this morning  Right sided flank pain last night and today

## 2020-10-09 NOTE — ED Notes (Signed)
Pt given water and snacks

## 2020-10-09 NOTE — ED Notes (Signed)
PA Johana @ [facility] - will come back and start IV

## 2020-10-09 NOTE — ED Notes (Addendum)
Called lab - spoke with Seward Grater - will process urine  Urine was sent to the lab before actual order and I did not call to have urine processed. I thought the lab would see the blood work orders and add on urine.

## 2020-10-09 NOTE — ED Notes (Signed)
RN back from lunch and pt states that while I was gone her BP "tanked" and c/o nausea and pain from [place] procedure. Pt has cool washcloth on forehead and states that she does not tolerate a lot of medications. BSC was brought into pt room - urine sample was obtained and sent to lab and pt is back on monitor and has call bell. Refused cool washcloth at this time

## 2020-10-09 NOTE — Discharge Instructions (Addendum)
We discussed the results of your ultrasound at length.  I provided you with a copy of your ultrasound results.  Please follow-up with your gynecologist as scheduled in the upcoming week.  I have prescribed a short course of anti-inflammatories to help with your pain, please take 1 tablet twice a day for the next 7 days.

## 2020-10-09 NOTE — ED Notes (Signed)
US @ [facility]. 

## 2020-10-09 NOTE — ED Notes (Signed)
Pt wanted me to note that she has a headache and a "bit" of nausea - does not want any medications - just [facility]

## 2020-10-09 NOTE — ED Provider Notes (Addendum)
[facility] EMERGENCY DEPARTMENT Provider Note   CSN: [id number] Arrival date & time: 10/09/20  1005     History Chief Complaint  Patient presents with  . Pelvic Pain  . Chest Pain  . Flank Pain    Diane Wheeler is a 35 y.o. female.  35 y.o female with a PMH Asthma, Migraines presents to the ED with a chief complaint of pelvic pain, chest pain, back pain which has been ongoing for the past 2 months.  Patient reports the pelvic pain has been ongoing for the past 2 months, reports the pain sometimes improves with her menstrual cycle she states this is "like cramps ", states that she feels like "my uterus is inflamed ", mainly pain feels like her overall ache.  However, last couple days pain has exacerbated in nature, describing it as a stabbing pain to the suprapubic area.  She had a prior CT about 6 to 7 years ago which showed that he she had some ovarian cysts, reports that she is unsure whether this is likely causing the pain.  States last night she had 1 episode of chest pressure, described as "kind of weird ", this was associated with palpitations and shortness of breath which later subsided without any intervention.  This morning she also reports having an episode of back pain while exiting her vehicle, states "my back hurts all the time ", however this felt like a sharp stabbing pain to the " kidney area "and radiating down.  Last menstrual period ended on 4/13, she is currently sexually active with 1 female partner.  Does report she has noticed her urine to have a foul smell to it along with being cloudy.  She denies any fever, vaginal discharge, URI symptoms.  No prior surgical intervention to her abdomen. Of note, she does have appointment scheduled with her gynecologist next week in order to obtain further evaluation of the pelvic pain.     The history is provided by the patient.  Pelvic Pain This is a recurrent problem. The current episode started more than 1 week ago.  Associated symptoms include chest pain, abdominal pain and shortness of breath.  Chest Pain Associated symptoms: abdominal pain, back pain, palpitations and shortness of breath   Associated symptoms: no fever, no nausea, no numbness and no vomiting   Flank Pain Associated symptoms include chest pain, abdominal pain and shortness of breath.       Past Medical History:  Diagnosis Date  . Asthma    as a child, occ flares when runs outside  . Chicken pox   . Headache(784.0)    frequent   . Migraines   . Palpitations     Patient Active Problem List   Diagnosis Date Noted  . Palpitations 08/13/2013  . Abnormal PFTs 09/08/2012  . Allergic rhinitis 08/03/2012  . Dyspnea 08/03/2012  . Chronic cough 08/03/2012    Past Surgical History:  Procedure Laterality Date  . NO PAST SURGERIES       OB History    Gravida  0   Para  0   Term  0   Preterm  0   AB  0   Living  0     SAB  0   IAB  0   Ectopic  0   Multiple  0   Live Births  0           Family History  Problem Relation Age of Onset  . Alcohol abuse Other  father's side of family  . Arthritis Maternal Grandfather   . Diabetes Maternal Grandfather   . Stroke Maternal Grandfather   . Heart disease Maternal Grandfather   . Arthritis Maternal Grandmother        also htn, copd, afib, stroke  . Hypertension Maternal Grandmother   . Stroke Maternal Grandmother   . Heart disease Other   . Ovarian cancer Maternal Aunt        uterine  . Hypertension Mother   . Diabetes Mother   . Depression Other        maternal side  . Anxiety disorder Other        maternal side   . Heart disease Paternal Grandmother     Social History   Tobacco Use  . Smoking status: Never Smoker  . Smokeless tobacco: Never Used  Substance Use Topics  . Alcohol use: Yes    Comment: occ, under safe drinking limits  . Drug use: No    Home Medications Prior to Admission medications   Medication Sig Start Date End  Date Taking? Authorizing Provider  ketoconazole (NIZORAL) 2 % cream Apply 1 application topically daily.   Yes [provider]  naproxen (NAPROSYN) 500 MG tablet Take 1 tablet (500 mg total) by mouth 2 (two) times daily for 7 days. 10/09/20 10/16/20 Yes Chibueze Beasley, Leonie Douglas, PA-C  ergocalciferol (VITAMIN D2) 1.25 MG (50000 UT) capsule Take 1 capsule by mouth once a week. 08/07/20   [provider]  oxyCODONE-acetaminophen (PERCOCET) 5-325 MG tablet Take 1 tablet by mouth every 4 (four) hours as needed (for pain). Patient not taking: No sig reported 11/07/16   Molpus, Jonny Ruiz, MD    Allergies    Amoxicillin, Dairy aid [lactase], and Percocet [oxycodone-acetaminophen]  Review of Systems   Review of Systems  Constitutional: Negative for fever.  HENT: Positive for sore throat.   Respiratory: Positive for shortness of breath.   Cardiovascular: Positive for chest pain and palpitations.  Gastrointestinal: Positive for abdominal pain. Negative for nausea and vomiting.  Genitourinary: Positive for flank pain and pelvic pain.  Musculoskeletal: Positive for back pain.  Skin: Negative for pallor and wound.  Neurological: Negative for syncope, light-headedness and numbness.  All other systems reviewed and are negative.   Physical Exam Updated Vital Signs BP (!) 104/58 (BP Location: Right Arm)   Pulse (!) 56   Temp 98.8 F (37.1 C) (Oral)   Resp 18   Ht 5\' 6"  (1.676 m)   Wt 101.2 kg   LMP 09/13/2020   SpO2 100%   BMI 35.99 kg/m   Physical Exam Vitals and nursing note reviewed.  Constitutional:      General: She is not in acute distress.    Appearance: She is well-developed.  HENT:     Head: Normocephalic and atraumatic.     Mouth/Throat:     Pharynx: No oropharyngeal exudate.  Eyes:     Pupils: Pupils are equal, round, and reactive to light.  Cardiovascular:     Rate and Rhythm: Regular rhythm.     Heart sounds: Normal heart sounds.  Pulmonary:     Effort: Pulmonary effort is  normal. No respiratory distress.     Breath sounds: Normal breath sounds.     Comments: Lungs are clear to auscultation. Abdominal:     General: Bowel sounds are normal. There is no distension.     Palpations: Abdomen is soft.     Tenderness: There is abdominal tenderness in the right lower quadrant,  suprapubic area and left lower quadrant. There is right CVA tenderness. There is no guarding. Negative signs include Murphy's sign and McBurney's sign.     Comments: Mild CVA tenderness. Bowel sounds present and equal.   Musculoskeletal:        General: No tenderness or deformity.     Cervical back: Normal range of motion.     Right lower leg: No edema.     Left lower leg: No edema.  Skin:    General: Skin is warm and dry.  Neurological:     Mental Status: She is alert and oriented to person, place, and time.     ED Results / Procedures / Treatments   Labs (all labs ordered are listed, but only abnormal results are displayed) Labs Reviewed  URINALYSIS, ROUTINE W REFLEX MICROSCOPIC - Abnormal; Notable for the following components:      Result Value   Hgb urine dipstick SMALL (*)    Leukocytes,Ua MODERATE (*)    All other components within normal limits  URINALYSIS, MICROSCOPIC (REFLEX) - Abnormal; Notable for the following components:   Bacteria, UA FEW (*)    All other components within normal limits  URINE CULTURE  CBC WITH DIFFERENTIAL/PLATELET  COMPREHENSIVE METABOLIC PANEL  LIPASE, BLOOD  HCG, QUANTITATIVE, PREGNANCY    EKG None  Radiology DG Chest 2 View  Result Date: 10/09/2020 CLINICAL DATA:  Chest pain EXAM: CHEST - 2 VIEW COMPARISON:  September 08, 2020 FINDINGS: Lungs are clear. Heart size and pulmonary vascularity are normal. No adenopathy. No pneumothorax. No bone lesions. IMPRESSION: Lungs clear.  Cardiac silhouette normal. Electronically Signed   By: Bretta Bang III M.D.   On: 10/09/2020 11:24   US PELVIC COMPLETE W TRANSVAGINAL AND TORSION R/O  Result Date:  10/09/2020 CLINICAL DATA:  Pelvic pain, LMP 09/13/2020, history of ovarian cyst EXAM: TRANSABDOMINAL AND TRANSVAGINAL ULTRASOUND OF PELVIS DOPPLER ULTRASOUND OF OVARIES TECHNIQUE: Both transabdominal and transvaginal ultrasound examinations of the pelvis were performed. Transabdominal technique was performed for global imaging of the pelvis including uterus, ovaries, adnexal regions, and pelvic cul-de-sac. It was necessary to proceed with endovaginal exam following the transabdominal exam to visualize the ovaries and endometrium. Color and duplex Doppler ultrasound was utilized to evaluate blood flow to the ovaries. COMPARISON:  06/11/2019 FINDINGS: Uterus Measurements: 8.1 x 4.2 x 4.6 cm = volume: 82 mL. Anteverted. Normal morphology without mass Endometrium Thickness: 15 mm. Heterogeneous. Hypoechoic nodule within endometrial complex 4 x 3 x 4 mm, question blood or cyst, doubt polyp. Right ovary Measurements: 2.8 x 1.4 x 1.5 cm = volume: 2.9 mL. Normal morphology without mass. Internal blood flow present on color Doppler imaging. Left ovary Measurements: 2.9 x 2.1 x 1.7 cm = volume: 5.3 mL. Normal morphology without mass. Internal blood flow present on color Doppler imaging. Pulsed Doppler evaluation of both ovaries demonstrates normal low-resistance arterial and venous waveforms. Other findings Trace free pelvic fluid.  No adnexal masses. IMPRESSION: Heterogeneous endometrial complex with questionable small focus of blood or endometrial cyst 4 mm diameter, doubt polyp; recommend follow-up ultrasound in 6-12 weeks during the week following menstrual cycle to reassess. Otherwise normal appearing uterus, ovaries and adnexa. Electronically Signed   By: Ulyses Southward M.D.   On: 10/09/2020 16:37    Procedures Procedures   Medications Ordered in ED Medications  ketorolac (TORADOL) 15 MG/ML injection 15 mg (15 mg Intravenous Given 10/09/20 1336)    ED Course  I have reviewed the triage vital signs and the nursing  notes.  Pertinent labs & imaging results that were available during my care of the patient were reviewed by me and considered in my medical decision making (see chart for details).  Clinical Course as of 10/09/20 1720  Thu Oct 09, 2020  1354 Glori LuisLeukocytes,Ua(!): MODERATE [JS]    Clinical Course User Index [JS] Claude MangesSoto, Clarkson Rosselli, PA-C   MDM Rules/Calculators/A&P  Patient presents to the ED with a chief complaint of pelvic pain, flank pain, back pain for the past 2 months.  Reports pelvic pain has been ongoing, back pain along with chest pain developed last night during a short episode.  She reports she has been scheduled to see her gynecologist in the upcoming week, currently sexually active with 1 partner.  Does endorse some foul odor, cloudy urine lately.  Does have a prior history of recurring UTIs.  Has not had any fever.  During evaluation patient is overall nontoxic-appearing, vitals are within normal limits, she is afebrile on arrival to the ED.  There is mild CVA tenderness noted to the right side.  Abdomen is soft, mildly tender to palpation along the suprapubic area and especially right lower quadrant.  Does have a prior history of ovarian cyst about 6 to 7 years ago on the right ovary.  Lungs are clear to auscultation without any wheezing, rhonchi, rales.  Interpretation of her labs reveal a CBC without any leukocytosis, no signs of anemia.  CMP without any electrolyte derangement, creatinine levels within normal limits.  LFTs are unremarkable.  Lipase levels normal.  hCG is negative.    Urinalysis shows small amount of hemoglobin, moderate leukocytes no nitrites, significant squamous 6-10 present with few bacteria.  We discussed sending this for urine culture.   2:05 PM patient was reevaluated by me, is currently eating without any nausea.  Does report continued discomfort after receiving Toradol.  We discussed imaging of pelvic region via CT to rule out any problems with the prior cyst she  has had in the past.  Ultrasound of the pelvis showed: Heterogeneous endometrial complex with questionable small focus of  blood or endometrial cyst 4 mm diameter, doubt polyp; recommend  follow-up ultrasound in 6-12 weeks during the week following  menstrual cycle to reassess.   Otherwise normal appearing uterus, ovaries and adnexa.   These results were discussed with patient, she reports concern for endometriosis.  I discussed with her that unfortunately our ultrasound imaging will not be able to rule this out, however she does have an appointment with gynecology in the upcoming week.  I am currently reviewing nursing notes which indicates patient's blood pressure "tanked ", I have reviewed her vital signs which do indicate patient has had some soft pressures with a systolic in the 100s and diastolics in the 50s.  Upon reevaluation she does not report much improvement from pain medication, she was given Toradol which she explains helped some with discomfort but pain has now returned.  Return precautions discussed at length, patient stable for discharge.    Portions of this note were generated with Scientist, clinical (histocompatibility and immunogenetics)Dragon dictation software. Dictation errors may occur despite best attempts at proofreading.  Final Clinical Impression(s) / ED Diagnoses Final diagnoses:  Pelvic pain    Rx / DC Orders ED Discharge Orders         Ordered    naproxen (NAPROSYN) 500 MG tablet  2 times daily        10/09/20 1711           Claude MangesSoto, Maryjean Corpening, PA-C 10/09/20  [address]    Claude Manges, PA-C 10/09/20 Rhina Brackett, MD 10/10/20 [phone]

## 2020-10-10 LAB — URINE CULTURE

## 2020-12-26 ENCOUNTER — Encounter (HOSPITAL_BASED_OUTPATIENT_CLINIC_OR_DEPARTMENT_OTHER): Payer: Self-pay | Admitting: *Deleted

## 2020-12-26 ENCOUNTER — Emergency Department (HOSPITAL_BASED_OUTPATIENT_CLINIC_OR_DEPARTMENT_OTHER): Payer: BLUE CROSS/BLUE SHIELD

## 2020-12-26 ENCOUNTER — Other Ambulatory Visit: Payer: Self-pay

## 2020-12-26 ENCOUNTER — Emergency Department (HOSPITAL_BASED_OUTPATIENT_CLINIC_OR_DEPARTMENT_OTHER)
Admission: EM | Admit: 2020-12-26 | Discharge: 2020-12-26 | Disposition: A | Discharge status: Home / Self Care | Payer: BLUE CROSS/BLUE SHIELD | Attending: Emergency Medicine | Admitting: Emergency Medicine

## 2020-12-26 DIAGNOSIS — J45909 Unspecified asthma, uncomplicated: Secondary | ICD-10-CM | POA: Diagnosis not present

## 2020-12-26 DIAGNOSIS — S0990XA Unspecified injury of head, initial encounter: Secondary | ICD-10-CM

## 2020-12-26 DIAGNOSIS — Y9389 Activity, other specified: Secondary | ICD-10-CM | POA: Diagnosis not present

## 2020-12-26 DIAGNOSIS — W208XXA Other cause of strike by thrown, projected or falling object, initial encounter: Secondary | ICD-10-CM | POA: Diagnosis not present

## 2020-12-26 NOTE — ED Provider Notes (Signed)
[facility] EMERGENCY DEPARTMENT Provider Note   CSN: [id number] Arrival date & time: 12/26/20  1449     History Chief Complaint  Patient presents with   Head Injury    Diane Wheeler is a 35 y.o. female.  Diane Wheeler is a 35 y.o. female with a history of headaches, asthma, palpitations, who presents to the ED for evaluation after head injury.  She reports she was up on a stepstool trying to move some stuff above her cupboard when a large glass cake plate cover fell striking her on the right side of her head.  She did not sustain any lacerations, did not fall from the stepstool or have loss of consciousness.  She reports since the head injury she has had some dizziness and some pain and tingling on the right side of her head.  No visual changes.  No numbness or weakness.  No confusion.  No history of recent head injury.  Patient not on any anticoagulation.  No other injuries.  The history is provided by the patient.      Past Medical History:  Diagnosis Date   Asthma    as a child, occ flares when runs outside   Chicken pox    Headache(784.0)    frequent    Migraines    Palpitations     Patient Active Problem List   Diagnosis Date Noted   Palpitations 08/13/2013   Abnormal PFTs 09/08/2012   Allergic rhinitis 08/03/2012   Dyspnea 08/03/2012   Chronic cough 08/03/2012    Past Surgical History:  Procedure Laterality Date   NO PAST SURGERIES       OB History     Gravida  0   Para  0   Term  0   Preterm  0   AB  0   Living  0      SAB  0   IAB  0   Ectopic  0   Multiple  0   Live Births  0           Family History  Problem Relation Age of Onset   Alcohol abuse Other        father's side of family   Arthritis Maternal Grandfather    Diabetes Maternal Grandfather    Stroke Maternal Grandfather    Heart disease Maternal Grandfather    Arthritis Maternal Grandmother        also htn, copd, afib, stroke   Hypertension  Maternal Grandmother    Stroke Maternal Grandmother    Heart disease Other    Ovarian cancer Maternal Aunt        uterine   Hypertension Mother    Diabetes Mother    Depression Other        maternal side   Anxiety disorder Other        maternal side    Heart disease Paternal Grandmother     Social History   Tobacco Use   Smoking status: Never   Smokeless tobacco: Never  Vaping Use   Vaping Use: Never used  Substance Use Topics   Alcohol use: Not Currently    Comment: occ, under safe drinking limits   Drug use: No    Home Medications Prior to Admission medications   Medication Sig Start Date End Date Taking? Authorizing Provider  ergocalciferol (VITAMIN D2) 1.25 MG (50000 UT) capsule Take 1 capsule by mouth once a week. 08/07/20  Yes [provider]  ketoconazole (NIZORAL) 2 %  cream Apply 1 application topically daily.    [provider]  oxyCODONE-acetaminophen (PERCOCET) 5-325 MG tablet Take 1 tablet by mouth every 4 (four) hours as needed (for pain). Patient not taking: No sig reported 11/07/16   Molpus, Jonny Ruiz, MD    Allergies    Amoxicillin, Dairy aid [lactase], and Percocet [oxycodone-acetaminophen]  Review of Systems   Review of Systems  Constitutional:  Negative for chills and fever.  HENT: Negative.    Eyes:  Negative for visual disturbance.  Gastrointestinal:  Negative for nausea and vomiting.  Musculoskeletal:  Negative for back pain and neck pain.  Skin:  Negative for color change and wound.  Neurological:  Positive for dizziness and headaches. Negative for seizures, syncope, weakness, light-headedness and numbness.  Psychiatric/Behavioral:  Negative for confusion.   All other systems reviewed and are negative.  Physical Exam Updated Vital Signs BP (!) 115/102 (BP Location: Right Arm)   Pulse 85   Temp 98.7 F (37.1 C) (Oral)   Resp 20   Ht 5\' 6"  (1.676 m)   Wt 101.2 kg   LMP 12/12/2020   SpO2 100%   BMI 36.01 kg/m   Physical  Exam Vitals and nursing note reviewed.  Constitutional:      General: She is not in acute distress.    Appearance: Normal appearance. She is well-developed. She is not ill-appearing or diaphoretic.  HENT:     Head: Normocephalic and atraumatic.     Comments: No hematoma, stepoff or deformity, neg battle sign. No bony tenderness over the face Eyes:     General:        Right eye: No discharge.        Left eye: No discharge.  Pulmonary:     Effort: Pulmonary effort is normal. No respiratory distress.  Neurological:     Mental Status: She is alert and oriented to person, place, and time.     Coordination: Coordination normal.  Psychiatric:        Mood and Affect: Mood normal.        Behavior: Behavior normal.    ED Results / Procedures / Treatments   Labs (all labs ordered are listed, but only abnormal results are displayed) Labs Reviewed - No data to display  EKG None  Radiology CT Head Wo Contrast  Result Date: 12/26/2020 CLINICAL DATA:  Dizziness after head injury today. EXAM: CT HEAD WITHOUT CONTRAST TECHNIQUE: Contiguous axial images were obtained from the base of the skull through the vertex without intravenous contrast. COMPARISON:  None. FINDINGS: Brain: No evidence of acute infarction, hemorrhage, hydrocephalus, extra-axial collection or mass lesion/mass effect. Vascular: No hyperdense vessel or unexpected calcification. Skull: Normal. Negative for fracture or focal lesion. Sinuses/Orbits: No acute finding. Other: None. IMPRESSION: No acute intracranial abnormality seen. Electronically Signed   By: 12/28/2020 M.D.   On: 12/26/2020 17:50    Procedures Procedures   Medications Ordered in ED Medications - No data to display  ED Course  I have reviewed the triage vital signs and the nursing notes.  Pertinent labs & imaging results that were available during my care of the patient were reviewed by me and considered in my medical decision making (see chart for  details).    MDM Rules/Calculators/A&P                           35 year old female presents with head injury, no loss of consciousness, headache and some dizziness but  no focal neurologic deficits noted on exam.  No evidence of basilar skull fracture.  Patient is alert and oriented, mentating at baseline.  Did have some intermittent periods.  CT of the.  I have personally reviewed CT, agree with radiologist findings, no acute intracranial abnormality.  Discussed reassuring CT with patient.  Discussed return precautions for head injury and symptoms of concussion as well as appropriate rest and supportive treatment and outpatient follow-up.  Patient expresses understanding and agreement.  Discharged home in good condition.  Final Clinical Impression(s) / ED Diagnoses Final diagnoses:  Injury of head, initial encounter    Rx / DC Orders ED Discharge Orders     None        Legrand Rams 12/26/20 Serita Kyle, MD 12/26/20 2329

## 2020-12-26 NOTE — ED Triage Notes (Signed)
A large glass cake cover fell from a cabinet hitting her in the right side of her face. Dizziness since. No LOC.

## 2020-12-26 NOTE — Discharge Instructions (Addendum)
You were examined today for a head injury and possible concussion.  Your head CT showed no evidence of  Injury today.   Sometimes serious problems can develop after a head injury. Please return to the emergency department if you experience any of the following symptoms: Repeated vomiting Headache that gets worse and does not go away Loss of consciousness or inability to stay awake at times when you normally would be able to Getting more confused, restless or agitated Convulsions or seizures Difficulty walking or feeling off balance Weakness or numbness Vision changes  A concussion is a very mild traumatic brain injury caused by a bump, jolt or blow to the head, most people recover quickly and fully. You can experience a wide variety of symptoms including:   - Confusion      - Difficulty concentrating       - Trouble remembering new info  - Headache      - Dizziness        - Fuzzy or blurry vision  - Fatigue      - Balance problems      - Light sensitivity  - Mood swings     - Changes in sleep or difficulty sleeping   To help these symptoms improve make sure you are getting plenty of rest, avoid screen time, loud music and strenuous mental activities. Avoid any strenuous physical activities, once your symptoms have resolved a slow and gradual return to activity is recommended. It is very important that you avoid situations in which you might sustain a second head injury as this can be very dangerous and life threatening.

## 2020-12-30 ENCOUNTER — Emergency Department (HOSPITAL_BASED_OUTPATIENT_CLINIC_OR_DEPARTMENT_OTHER): Payer: BLUE CROSS/BLUE SHIELD

## 2020-12-30 ENCOUNTER — Encounter (HOSPITAL_BASED_OUTPATIENT_CLINIC_OR_DEPARTMENT_OTHER): Payer: Self-pay | Admitting: *Deleted

## 2020-12-30 ENCOUNTER — Emergency Department (HOSPITAL_BASED_OUTPATIENT_CLINIC_OR_DEPARTMENT_OTHER)
Admission: EM | Admit: 2020-12-30 | Discharge: 2020-12-30 | Disposition: A | Discharge status: Home / Self Care | Payer: BLUE CROSS/BLUE SHIELD | Attending: Emergency Medicine | Admitting: Emergency Medicine

## 2020-12-30 ENCOUNTER — Other Ambulatory Visit: Payer: Self-pay

## 2020-12-30 DIAGNOSIS — S161XXA Strain of muscle, fascia and tendon at neck level, initial encounter: Secondary | ICD-10-CM | POA: Diagnosis not present

## 2020-12-30 DIAGNOSIS — W01198A Fall on same level from slipping, tripping and stumbling with subsequent striking against other object, initial encounter: Secondary | ICD-10-CM | POA: Diagnosis not present

## 2020-12-30 DIAGNOSIS — S060X0A Concussion without loss of consciousness, initial encounter: Secondary | ICD-10-CM | POA: Diagnosis not present

## 2020-12-30 DIAGNOSIS — S060X0D Concussion without loss of consciousness, subsequent encounter: Secondary | ICD-10-CM

## 2020-12-30 DIAGNOSIS — J45909 Unspecified asthma, uncomplicated: Secondary | ICD-10-CM | POA: Diagnosis not present

## 2020-12-30 DIAGNOSIS — S0990XA Unspecified injury of head, initial encounter: Secondary | ICD-10-CM

## 2020-12-30 MED ORDER — ONDANSETRON 4 MG PO TBDP: 4.0000 mg | ORAL_TABLET | Freq: Three times a day (TID) | ORAL | 0 refills | Status: AC | PRN

## 2020-12-30 NOTE — ED Triage Notes (Signed)
She lost her balance and fell backward hitting the back of her head against the wall. No loc. Headache. She is not taking blood thinners. She is ambulatory, alert and oriented. Pressure on the right side of her head.

## 2020-12-30 NOTE — Discharge Instructions (Addendum)
You were seen in the emerge department today after another head injury.  Your CT scan of the head and neck were normal.  I would like you to call your neurologist to schedule the next available follow-up appointment.  Please rest if you get tired or fatigued. Return to the ED with any new or worsening symptoms.

## 2020-12-30 NOTE — ED Provider Notes (Signed)
Emergency Department Provider Note   I have reviewed the triage vital signs and the nursing notes.   HISTORY  Chief Complaint Fall   HPI Diane Wheeler is a 35 y.o. female who presents to the emergency department for evaluation of additional head injury.  Patient states that she was seen in the emergency department recently for a fall with head injury and thinks she may have sustained a concussion.  She has had some persistent headache along with nausea and difficulty focusing.  She was trying to get out of bed today and lost her balance falling backwards hitting her head along the back of the wall.  There was no loss of consciousness.  She is having headache particularly in the back of her head.  She is feeling some discomfort at the base of her neck as well.  No numbness or weakness in the arms or legs.  No vision disturbance.  No speech disturbance.  She does have a neurologist but no prior history of concussion.   Past Medical History:  Diagnosis Date   Asthma    as a child, occ flares when runs outside   Chicken pox    Headache(784.0)    frequent    Migraines    Palpitations     Patient Active Problem List   Diagnosis Date Noted   Palpitations 08/13/2013   Abnormal PFTs 09/08/2012   Allergic rhinitis 08/03/2012   Dyspnea 08/03/2012   Chronic cough 08/03/2012    Past Surgical History:  Procedure Laterality Date   NO PAST SURGERIES      Allergies Amoxicillin, Dairy aid [lactase], and Percocet [oxycodone-acetaminophen]  Family History  Problem Relation Age of Onset   Alcohol abuse Other        father's side of family   Arthritis Maternal Grandfather    Diabetes Maternal Grandfather    Stroke Maternal Grandfather    Heart disease Maternal Grandfather    Arthritis Maternal Grandmother        also htn, copd, afib, stroke   Hypertension Maternal Grandmother    Stroke Maternal Grandmother    Heart disease Other    Ovarian cancer Maternal Aunt        uterine    Hypertension Mother    Diabetes Mother    Depression Other        maternal side   Anxiety disorder Other        maternal side    Heart disease Paternal Grandmother     Social History Social History   Tobacco Use   Smoking status: Never   Smokeless tobacco: Never  Vaping Use   Vaping Use: Never used  Substance Use Topics   Alcohol use: Not Currently    Comment: occ, under safe drinking limits   Drug use: No    Review of Systems  Constitutional: No fever/chills Eyes: No visual changes. ENT: No sore throat. Cardiovascular: Denies chest pain. Respiratory: Denies shortness of breath. Gastrointestinal: No abdominal pain.  No nausea, no vomiting.  No diarrhea.  No constipation. Genitourinary: Negative for dysuria. Musculoskeletal: Negative for back pain. Skin: Negative for rash. Neurological: Negative for focal weakness or numbness. Positive HA.   10-point ROS otherwise negative.  ____________________________________________   PHYSICAL EXAM:  VITAL SIGNS: ED Triage Vitals  Enc Vitals Group     BP 12/30/20 1721 126/85     Pulse Rate 12/30/20 1721 85     Resp 12/30/20 1721 18     Temp 12/30/20 1721 98.4 F (36.9  C)     Temp Source 12/30/20 1721 Oral     SpO2 12/30/20 1721 98 %     Weight 12/30/20 1720 223 lb 1.7 oz (101.2 kg)     Height 12/30/20 1720 5\' 6"  (1.676 m)   Constitutional: Alert and oriented. Well appearing and in no acute distress. Eyes: Conjunctivae are normal. PERRL. EOMI. Head: Atraumatic. Nose: No congestion/rhinnorhea. Mouth/Throat: Mucous membranes are moist.  Oropharynx non-erythematous. Neck: No stridor. No cervical spine tenderness to palpation. Some high paracervical tenderness.  Cardiovascular: Normal rate, regular rhythm. Good peripheral circulation. Grossly normal heart sounds.   Respiratory: Normal respiratory effort.  No retractions. Lungs CTAB. Gastrointestinal: Soft and nontender. No distention.  Musculoskeletal: No lower  extremity tenderness nor edema. No gross deformities of extremities. Neurologic:  Normal speech and language. No gross focal neurologic deficits are appreciated.  Skin:  Skin is warm, dry and intact. No rash noted.  ____________________________________________   LABS (all labs ordered are listed, but only abnormal results are displayed)  Labs Reviewed - No data to display  ____________________________________________  RADIOLOGY  CT head and c-spine reviewed.   ____________________________________________   PROCEDURES  Procedure(s) performed:   Procedures  None  ____________________________________________   INITIAL IMPRESSION / ASSESSMENT AND PLAN / ED COURSE  Pertinent labs & imaging results that were available during my care of the patient were reviewed by me and considered in my medical decision making (see chart for details).   Patient with symptoms consistent with postconcussion syndrome from her initial fall.  She had an additional head injury today with more severe headache and some neck pain along with tenderness on exam.  Repeated CT imaging of the head and cervical spine with no acute findings.  Patient has a neurologist and we discussed close follow-up with them regarding her likely postconcussion syndrome symptoms.  I do not see evidence of stroke or spine injury to prompt emergency MRI.  Discussed symptom management including Zofran at home for nausea, brain rest, close PCP follow-up.   ____________________________________________  FINAL CLINICAL IMPRESSION(S) / ED DIAGNOSES  Final diagnoses:  Injury of head, initial encounter  Acute strain of neck muscle, initial encounter  Concussion without loss of consciousness, subsequent encounter     Note:  This document was prepared using Dragon voice recognition software and may include unintentional dictation errors.  , MD, [facility]    Gokul Waybright, [facility], MD 01/01/21 [phone]

## 2021-01-20 ENCOUNTER — Ambulatory Visit: Payer: No Typology Code available for payment source | Admitting: Family Medicine

## 2021-01-22 ENCOUNTER — Other Ambulatory Visit: Payer: Self-pay

## 2021-01-22 ENCOUNTER — Emergency Department (HOSPITAL_BASED_OUTPATIENT_CLINIC_OR_DEPARTMENT_OTHER): Payer: BLUE CROSS/BLUE SHIELD

## 2021-01-22 ENCOUNTER — Encounter (HOSPITAL_BASED_OUTPATIENT_CLINIC_OR_DEPARTMENT_OTHER): Payer: Self-pay | Admitting: *Deleted

## 2021-01-22 ENCOUNTER — Emergency Department (HOSPITAL_BASED_OUTPATIENT_CLINIC_OR_DEPARTMENT_OTHER)
Admission: EM | Admit: 2021-01-22 | Discharge: 2021-01-23 | Disposition: A | Discharge status: Home / Self Care | Payer: BLUE CROSS/BLUE SHIELD | Attending: Emergency Medicine | Admitting: Emergency Medicine

## 2021-01-22 DIAGNOSIS — R519 Headache, unspecified: Secondary | ICD-10-CM | POA: Diagnosis not present

## 2021-01-22 DIAGNOSIS — J45909 Unspecified asthma, uncomplicated: Secondary | ICD-10-CM | POA: Diagnosis not present

## 2021-01-22 MED ORDER — IBUPROFEN 800 MG PO TABS
800.0000 mg | ORAL_TABLET | Freq: Once | ORAL | Status: AC
  Administered 2021-01-22: 800 mg via ORAL
  Filled 2021-01-22: qty 1

## 2021-01-22 MED ORDER — CYCLOBENZAPRINE HCL 5 MG PO TABS
5.0000 mg | ORAL_TABLET | Freq: Once | ORAL | Status: AC
  Administered 2021-01-22: 5 mg via ORAL
  Filled 2021-01-22: qty 1

## 2021-01-22 NOTE — ED Triage Notes (Signed)
Numbness to the right side of her face x 3 days. Burning to her forehead. Right ear pain. States she has post concussion syndrome diagnosed 3 weeks ago. She is crying uncontrollable at triage.

## 2021-01-22 NOTE — ED Notes (Signed)
Pt tearful r/t concern.  States increase fatigue over past 3 days, states right sided facial numbness that started on Sunday, states burning sensation to right side of forehead.  States pressure to right ear and occipital region as well.  Seen at [facility] tuesday and dx with post concussive syndrome.  States blurry vision intermittently and nausea.

## 2021-01-22 NOTE — ED Provider Notes (Signed)
[facility] EMERGENCY DEPARTMENT Provider Note   CSN: [id number] Arrival date & time: 01/22/21  2116     History Chief Complaint  Patient presents with   Headache    Diane Wheeler is a 35 y.o. female.  HPI  Patient with no significant medical history presents  with chief complaint of a headache.  Patient states she has been having headache for the last month, states it happened after she was diagnosed with a concussion.  States she has a headache on the right side of her head, she feels like a throbbing-like sensation, she also notes that she had slight visual changes which have since resolved, and feels a cold like sensation on the right side of her forehead.  She denies paresthesias or weakness the upper lower extremities, denies recent head trauma, not on anticoagulant, she denies history of IV drug use, denies associated fevers or chills.  She denies any alleviating factors.  Patient states this feels slightly did not from her normal headaches that she generally does have the cold like sensation on the right side of her head.  Patient does not endorse chest pain, shortness of breath, abdominal pain, worsening pedal edema.  After reviewing patient's chart patient has been seen on 07/22, 07/26 in the emergency department for similar complaint had negative CT imaging at that time.  recently seen at neurosurgery on 06/16 she had a benign work-up at that time and is follow-up with neurology as well as the concussion clinic this week for reevaluation.    Past Medical History:  Diagnosis Date   Asthma    as a child, occ flares when runs outside   Chicken pox    Headache(784.0)    frequent    Migraines    Palpitations     Patient Active Problem List   Diagnosis Date Noted   Palpitations 08/13/2013   Abnormal PFTs 09/08/2012   Allergic rhinitis 08/03/2012   Dyspnea 08/03/2012   Chronic cough 08/03/2012    Past Surgical History:  Procedure Laterality Date   NO PAST  SURGERIES       OB History     Gravida  0   Para  0   Term  0   Preterm  0   AB  0   Living  0      SAB  0   IAB  0   Ectopic  0   Multiple  0   Live Births  0           Family History  Problem Relation Age of Onset   Alcohol abuse Other        father's side of family   Arthritis Maternal Grandfather    Diabetes Maternal Grandfather    Stroke Maternal Grandfather    Heart disease Maternal Grandfather    Arthritis Maternal Grandmother        also htn, copd, afib, stroke   Hypertension Maternal Grandmother    Stroke Maternal Grandmother    Heart disease Other    Ovarian cancer Maternal Aunt        uterine   Hypertension Mother    Diabetes Mother    Depression Other        maternal side   Anxiety disorder Other        maternal side    Heart disease Paternal Grandmother     Social History   Tobacco Use   Smoking status: Never   Smokeless tobacco: Never  Vaping Use  Vaping Use: Never used  Substance Use Topics   Alcohol use: Not Currently    Comment: occ, under safe drinking limits   Drug use: No    Home Medications Prior to Admission medications   Medication Sig Start Date End Date Taking? Authorizing Provider  ergocalciferol (VITAMIN D2) 1.25 MG (50000 UT) capsule Take 1 capsule by mouth once a week. 08/07/20  Yes [provider]  ketoconazole (NIZORAL) 2 % cream Apply 1 application topically daily.    [provider]  ondansetron (ZOFRAN ODT) 4 MG disintegrating tablet Take 1 tablet (4 mg total) by mouth every 8 (eight) hours as needed. 12/30/20   Long, Arlyss Repress, MD  oxyCODONE-acetaminophen (PERCOCET) 5-325 MG tablet Take 1 tablet by mouth every 4 (four) hours as needed (for pain). Patient not taking: No sig reported 11/07/16   Molpus, Jonny Ruiz, MD    Allergies    Amoxicillin, Dairy aid [lactase], and Percocet [oxycodone-acetaminophen]  Review of Systems   Review of Systems  Constitutional:  Negative for chills and fever.   HENT:  Negative for congestion.   Respiratory:  Negative for shortness of breath.   Cardiovascular:  Negative for chest pain.  Gastrointestinal:  Negative for abdominal pain.  Genitourinary:  Negative for enuresis.  Musculoskeletal:  Negative for back pain.  Skin:  Negative for rash.  Neurological:  Positive for numbness and headaches. Negative for dizziness.  Hematological:  Does not bruise/bleed easily.   Physical Exam Updated Vital Signs BP (!) 111/56 (BP Location: Left Arm)   Pulse 63   Temp 98.2 F (36.8 C) (Oral)   Resp 18   Ht 5\' 6"  (1.676 m)   Wt 101.2 kg   LMP 01/22/2021   SpO2 100%   BMI 36.01 kg/m   Physical Exam Vitals and nursing note reviewed.  Constitutional:      General: She is not in acute distress.    Appearance: She is not ill-appearing.  HENT:     Head: Normocephalic and atraumatic.     Comments: Head was palpated there is no gross deformities present.    Nose: No congestion.  Eyes:     Extraocular Movements: Extraocular movements intact.     Conjunctiva/sclera: Conjunctivae normal.     Pupils: Pupils are equal, round, and reactive to light.  Cardiovascular:     Rate and Rhythm: Normal rate and regular rhythm.     Pulses: Normal pulses.     Heart sounds: No murmur heard.   No friction rub. No gallop.  Pulmonary:     Effort: No respiratory distress.     Breath sounds: No wheezing, rhonchi or rales.  Musculoskeletal:     Cervical back: Neck supple.     Comments: Patient has 5 of 5 strength, full range of motion in the upper and lower extremities.  Skin:    General: Skin is warm and dry.  Neurological:     Mental Status: She is alert.     Motor: No weakness.     Coordination: Romberg sign negative. Finger-Nose-Finger Test normal.     Gait: Gait is intact.     Comments: Cranial nerves II through XII are grossly intact, no difficulty word finding, no slurring of her words, able to follow two-step commands, no unilateral weakness, gait fully  intact.  Psychiatric:        Mood and Affect: Mood normal.    ED Results / Procedures / Treatments   Labs (all labs ordered are listed, but only abnormal results  are displayed) Labs Reviewed - No data to display  EKG None  Radiology CT HEAD WO CONTRAST ( )  Result Date: 01/22/2021 CLINICAL DATA:  Headache, chronic, new features or increased frequency-LMP today EXAM: CT HEAD WITHOUT CONTRAST TECHNIQUE: Contiguous axial images were obtained from the base of the skull through the vertex without intravenous contrast. COMPARISON:  CT head 12/30/2020 FINDINGS: Brain: No evidence of large-territorial acute infarction. No parenchymal hemorrhage. No mass lesion. No extra-axial collection. No mass effect or midline shift. No hydrocephalus. Basilar cisterns are patent. Vascular: No hyperdense vessel. Skull: No acute fracture or focal lesion. Sinuses/Orbits: Paranasal sinuses and mastoid air cells are clear. The orbits are unremarkable. Other: Similar appearing pericentimeter subcutaneus scalp soft tissue densities with associated calcifications (2:25, 23). IMPRESSION: 1. No acute intracranial abnormality. 2. Pericentimeter scalp lesions possibly representing sebaceous cyst. Correlate with physical exam. Electronically Signed   By: Tish Frederickson M.D.   On: 01/22/2021 23:01    Procedures Procedures   Medications Ordered in ED Medications  cyclobenzaprine (FLEXERIL) tablet 5 mg (5 mg Oral Given 01/22/21 2227)  ibuprofen (ADVIL) tablet 800 mg (800 mg Oral Given 01/22/21 2331)    ED Course  I have reviewed the triage vital signs and the nursing notes.  Pertinent labs & imaging results that were available during my care of the patient were reviewed by me and considered in my medical decision making (see chart for details).    MDM Rules/Calculators/A&P                          Initial impression-patient presents with a headache.  She is alert, does not appear acute stress, vital signs reassuring.   Due to new symptoms of cold sensation on the right side of her forehead will obtain CT imaging for further evaluation.  Will provide patient with pain medication and reassess.  Work-up-CT imaging negative for acute findings.  Reassessment-patient reassessed, states she is having a slight headache, will provide patient with ibuprofen and reassess.  Patient feeling better, patient agreed for discharge.  Rule out-low suspicion for intracranial head bleed and or mass as CT imaging is negative for acute findings.  I have low suspicion for CVA as she has no focal deficits present on exam.  Low suspicion for carotid or vertebral dissection as presentation is atypical, patient has low risk factors.  Low suspicion for meningitis and/or systemic infection as patient is nontoxic-appearing, vital signs reassuring, no meningeal she will sign present my exam.   Plan-  Headache improved-likely patient is having from postconcussion syndrome, possible migraines.  We will have her continue with over-the-counter pain medications, follow-up with neurology and the concussion clinic for further evaluation.  Vital signs have remained stable, no indication for hospital admission.   Patient given at home care as well strict return precautions.  Patient verbalized that they understood agreed to said plan.  Final Clinical Impression(s) / ED Diagnoses Final diagnoses:  Bad headache    Rx / DC Orders ED Discharge Orders     None        Carroll Sage, PA-C 01/23/21 0008    Virgina Norfolk, DO 01/23/21 1416

## 2021-01-22 NOTE — ED Notes (Signed)
Patient transported to CT 

## 2021-01-23 NOTE — Discharge Instructions (Addendum)
Exam and imaging are reassuring.  I recommend continue with over-the-counter pain medications.  I recommend decreasing mental stimulation i.e. screen time, intense problem-solving like reading or performing puzzles, and slowly reintroduce them as tolerated.  I also recommend decreasing  exercising as this can also provoke headaches and worsening concussion-like symptoms would  Reintroduce them as tolerated.  Please follow-up with the concussion clinic for further evaluation.  Please follow-up with neurology for further evaluation.  Come back to the emergency department if you develop chest pain, shortness of breath, severe abdominal pain, uncontrolled nausea, vomiting, diarrhea.  Severe headaches, slurring of your words, unilateral weakness as you symptoms of require further evaluation.

## 2021-02-22 ENCOUNTER — Encounter (HOSPITAL_BASED_OUTPATIENT_CLINIC_OR_DEPARTMENT_OTHER): Payer: Self-pay | Admitting: *Deleted

## 2021-02-22 ENCOUNTER — Other Ambulatory Visit: Payer: Self-pay

## 2021-02-22 ENCOUNTER — Emergency Department (HOSPITAL_BASED_OUTPATIENT_CLINIC_OR_DEPARTMENT_OTHER)
Admission: EM | Admit: 2021-02-22 | Discharge: 2021-02-23 | Disposition: A | Discharge status: Home / Self Care | Payer: BLUE CROSS/BLUE SHIELD | Attending: Emergency Medicine | Admitting: Emergency Medicine

## 2021-02-22 DIAGNOSIS — J45909 Unspecified asthma, uncomplicated: Secondary | ICD-10-CM | POA: Diagnosis not present

## 2021-02-22 DIAGNOSIS — R208 Other disturbances of skin sensation: Secondary | ICD-10-CM

## 2021-02-22 DIAGNOSIS — T7840XA Allergy, unspecified, initial encounter: Secondary | ICD-10-CM | POA: Diagnosis present

## 2021-02-22 MED ORDER — DIPHENHYDRAMINE HCL 50 MG/ML IJ SOLN
25.0000 mg | Freq: Once | INTRAMUSCULAR | Status: DC
  Filled 2021-02-22: qty 1

## 2021-02-22 MED ORDER — FAMOTIDINE IN NACL 20-0.9 MG/50ML-% IV SOLN
20.0000 mg | Freq: Once | INTRAVENOUS | Status: DC
  Filled 2021-02-22: qty 50

## 2021-02-22 MED ORDER — DIPHENHYDRAMINE HCL 25 MG PO CAPS
50.0000 mg | ORAL_CAPSULE | Freq: Once | ORAL | Status: AC
  Administered 2021-02-23: 50 mg via ORAL
  Filled 2021-02-22: qty 2

## 2021-02-22 MED ORDER — FAMOTIDINE 20 MG PO TABS
20.0000 mg | ORAL_TABLET | Freq: Once | ORAL | Status: AC
  Administered 2021-02-23: 20 mg via ORAL
  Filled 2021-02-22: qty 1

## 2021-02-22 MED ORDER — METHYLPREDNISOLONE SODIUM SUCC 125 MG IJ SOLR
80.0000 mg | Freq: Once | INTRAMUSCULAR | Status: DC
  Filled 2021-02-22: qty 2

## 2021-02-22 NOTE — ED Provider Notes (Signed)
[facility] EMERGENCY DEPARTMENT Provider Note   CSN: [id number] Arrival date & time: 02/22/21  2216     History Chief Complaint  Patient presents with   Allergic Reaction    Diane Wheeler is a 35 y.o. female.  Patient is a 35 year old female with past medical history of asthma, migraines, palpitations.  She presents today for evaluation of burning in her mouth and tongue.  Patient was seen at urgent care yesterday and was told she was likely having an allergic reaction to Invisalign tray that she recently started.  She feels a burning in her tongue and throat and feels short of breath.  She denies any chest pain.  She does describe some numbness in her feet.    The history is provided by the patient.  Allergic Reaction Presenting symptoms: swelling   Severity:  Moderate Relieved by:  Nothing Worsened by:  Nothing     Past Medical History:  Diagnosis Date   Asthma    as a child, occ flares when runs outside   Chicken pox    Headache(784.0)    frequent    Migraines    Palpitations     Patient Active Problem List   Diagnosis Date Noted   Palpitations 08/13/2013   Abnormal PFTs 09/08/2012   Allergic rhinitis 08/03/2012   Dyspnea 08/03/2012   Chronic cough 08/03/2012    Past Surgical History:  Procedure Laterality Date   NO PAST SURGERIES       OB History     Gravida  0   Para  0   Term  0   Preterm  0   AB  0   Living  0      SAB  0   IAB  0   Ectopic  0   Multiple  0   Live Births  0           Family History  Problem Relation Age of Onset   Alcohol abuse Other        father's side of family   Arthritis Maternal Grandfather    Diabetes Maternal Grandfather    Stroke Maternal Grandfather    Heart disease Maternal Grandfather    Arthritis Maternal Grandmother        also htn, copd, afib, stroke   Hypertension Maternal Grandmother    Stroke Maternal Grandmother    Heart disease Other    Ovarian cancer Maternal Aunt         uterine   Hypertension Mother    Diabetes Mother    Depression Other        maternal side   Anxiety disorder Other        maternal side    Heart disease Paternal Grandmother     Social History   Tobacco Use   Smoking status: Never   Smokeless tobacco: Never  Vaping Use   Vaping Use: Never used  Substance Use Topics   Alcohol use: Not Currently    Comment: occ, under safe drinking limits   Drug use: No    Home Medications Prior to Admission medications   Medication Sig Start Date End Date Taking? Authorizing Provider  ergocalciferol (VITAMIN D2) 1.25 MG (50000 UT) capsule Take 1 capsule by mouth once a week. 08/07/20   [provider]  ketoconazole (NIZORAL) 2 % cream Apply 1 application topically daily.    [provider]  ondansetron (ZOFRAN ODT) 4 MG disintegrating tablet Take 1 tablet (4 mg total) by mouth  every 8 (eight) hours as needed. 12/30/20   Long, Arlyss Repress, MD  oxyCODONE-acetaminophen (PERCOCET) 5-325 MG tablet Take 1 tablet by mouth every 4 (four) hours as needed (for pain). Patient not taking: No sig reported 11/07/16   Molpus, Jonny Ruiz, MD    Allergies    Amoxicillin, Dairy aid [lactase], and Percocet [oxycodone-acetaminophen]  Review of Systems   Review of Systems  All other systems reviewed and are negative.  Physical Exam Updated Vital Signs BP (!) 122/91 (BP Location: Left Arm)   Pulse 84   Temp 98.3 F (36.8 C) (Oral)   Resp 20   Ht 5\' 6"  (1.676 m)   Wt 102.1 kg   LMP 02/15/2021 (Approximate)   SpO2 100%   BMI 36.32 kg/m   Physical Exam Vitals and nursing note reviewed.  Constitutional:      General: She is not in acute distress.    Appearance: She is well-developed. She is not diaphoretic.  HENT:     Head: Normocephalic and atraumatic.     Mouth/Throat:     Mouth: Mucous membranes are moist.     Pharynx: Oropharynx is clear. No oropharyngeal exudate or posterior oropharyngeal erythema.     Comments: The oral mucosa is  normal in appearance.  There is no swelling, erythema, or obvious abnormality.  There is no stridor. Cardiovascular:     Rate and Rhythm: Normal rate and regular rhythm.     Heart sounds: No murmur heard.   No friction rub. No gallop.  Pulmonary:     Effort: Pulmonary effort is normal. No respiratory distress.     Breath sounds: Normal breath sounds. No wheezing.  Abdominal:     General: Bowel sounds are normal. There is no distension.     Palpations: Abdomen is soft.     Tenderness: There is no abdominal tenderness.  Musculoskeletal:        General: Normal range of motion.     Cervical back: Normal range of motion and neck supple.  Skin:    General: Skin is warm and dry.  Neurological:     General: No focal deficit present.     Mental Status: She is alert and oriented to person, place, and time.    ED Results / Procedures / Treatments   Labs (all labs ordered are listed, but only abnormal results are displayed) Labs Reviewed - No data to display  EKG EKG Interpretation  Date/Time:  Monday February 23 2021 01:27:46 EDT Ventricular Rate:  60 PR Interval:  140 QRS Duration: 86 QT Interval:  425 QTC Calculation: 425 R Axis:   29 Text Interpretation: Sinus rhythm Normal ECG Confirmed by [id number] (Geoffery Lyons) on 02/23/2021 1:30:51 AM  Radiology No results found.  Procedures Procedures   Medications Ordered in ED Medications  methylPREDNISolone sodium succinate (SOLU-MEDROL) 125 mg/2 mL injection 80 mg (has no administration in time range)  famotidine (PEPCID) IVPB 20 mg premix (has no administration in time range)  diphenhydrAMINE (BENADRYL) injection 25 mg (has no administration in time range)    ED Course  I have reviewed the triage vital signs and the nursing notes.  Pertinent labs & imaging results that were available during my care of the patient were reviewed by me and considered in my medical decision making (see chart for details).    MDM  Rules/Calculators/A&P  Patient presents here with complaints of swelling and burning of her tongue and mouth.  Patient recently received new Invisalyn trays which she believes may have  caused a reaction.  She was seen in urgent care earlier today and given prednisone.  She has taken 1 dose of this and 1 dose of Benadryl earlier in the day.  On exam, I see no swelling, erythema, or abnormality within the oral cavity including the oral mucosa, gingiva, and tongue.  The posterior oropharynx is clear with no swelling of the uvula or tonsils.  Patient was describing swelling in her throat and the plan was to administer intravenous medications to treat for a possible allergic reaction.  Patient was unable to tolerate the establishment of an IV, so these medications were given orally.    Patient was observed for a period of nearly 2 hours, then reassessed.  She told me she felt lightheaded and as if she was going to pass out.  She also felt as though her throat was getting worse.  At this point, my plan was to perform a CT scan of the soft tissues of the neck to further evaluate her symptoms, however the patient again was unable to tolerate the stick for an IV.  She then informed the nurse that she preferred to go home and follow-up with her doctors later this week.  Patient's vitals are stable, I hear no stridor, there is no hypoxia, and I see no abnormality within the oral mucosa.  I am uncertain as to what is causing her symptoms, but nothing today appears emergent.  She has had multiple doctors office visits in the past several months including visits for hand pain, pelvic pain, palpitations, mouth swelling, with all of the above visits coming in the month of September.  From what I can tell from reviewing her record, I do not see is to wear any definitive diagnosis has been found.  Final Clinical Impression(s) / ED Diagnoses Final diagnoses:  None    Rx / DC Orders ED Discharge Orders     None         Geoffery Lyons, MD 02/23/21 [phone]

## 2021-02-22 NOTE — ED Notes (Signed)
Pt tearful upon IV catheter insertion. Requests a few minutes to think about it.

## 2021-02-22 NOTE — ED Triage Notes (Signed)
Pt reports she got a new batch of invisalign trays last week and since then she has been having rash, tongue burning, and throat sore. She went to urgent care on Friday and they told her she was allergic to the plastic in the trays. She has been taking prednisone but tonight felt worse.

## 2021-02-23 MED ORDER — SODIUM CHLORIDE 0.9 % IV BOLUS: 1000.0000 mL | Freq: Once | INTRAVENOUS | Status: DC

## 2021-02-23 NOTE — ED Notes (Signed)
Pt requests to hold off on IV/ labs/ iv insertion. Reports feeling stable. Admits she has numerous follow up appointments and seeing cardiologist thursday for echo. Requests to follow up with PCP and plan accordingly.

## 2021-02-23 NOTE — Discharge Instructions (Addendum)
Continue prednisone as previously prescribed.  Begin taking Benadryl 25 mg every 6 hours for the next 3 days.  Take Pepcid 20 mg twice daily.  Follow-up with your primary doctor and other specialists as scheduled.

## 2021-05-05 ENCOUNTER — Other Ambulatory Visit: Payer: Self-pay | Admitting: Otolaryngology

## 2021-05-05 DIAGNOSIS — B370 Candidal stomatitis: Secondary | ICD-10-CM

## 2021-05-05 DIAGNOSIS — R1314 Dysphagia, pharyngoesophageal phase: Secondary | ICD-10-CM

## 2021-06-08 ENCOUNTER — Emergency Department (HOSPITAL_BASED_OUTPATIENT_CLINIC_OR_DEPARTMENT_OTHER)
Admission: EM | Admit: 2021-06-08 | Discharge: 2021-06-08 | Disposition: A | Discharge status: Home / Self Care | Payer: No Typology Code available for payment source | Attending: Emergency Medicine | Admitting: Emergency Medicine

## 2021-06-08 ENCOUNTER — Encounter (HOSPITAL_BASED_OUTPATIENT_CLINIC_OR_DEPARTMENT_OTHER): Payer: Self-pay | Admitting: *Deleted

## 2021-06-08 ENCOUNTER — Other Ambulatory Visit: Payer: Self-pay

## 2021-06-08 ENCOUNTER — Emergency Department (HOSPITAL_BASED_OUTPATIENT_CLINIC_OR_DEPARTMENT_OTHER): Payer: No Typology Code available for payment source

## 2021-06-08 DIAGNOSIS — R0789 Other chest pain: Secondary | ICD-10-CM | POA: Insufficient documentation

## 2021-06-08 LAB — CBC WITH DIFFERENTIAL/PLATELET
Abs Immature Granulocytes: 0.05 10*3/uL (ref 0.00–0.07)
Basophils Absolute: 0 10*3/uL (ref 0.0–0.1)
Basophils Relative: 0 %
Eosinophils Absolute: 0.1 10*3/uL (ref 0.0–0.5)
Eosinophils Relative: 1 %
HCT: 40.7 % (ref 36.0–46.0)
Hemoglobin: 13.6 g/dL (ref 12.0–15.0)
Immature Granulocytes: 0 %
Lymphocytes Relative: 36 %
Lymphs Abs: 4.8 10*3/uL — ABNORMAL HIGH (ref 0.7–4.0)
MCH: 29.2 pg (ref 26.0–34.0)
MCHC: 33.4 g/dL (ref 30.0–36.0)
MCV: 87.5 fL (ref 80.0–100.0)
Monocytes Absolute: 0.9 10*3/uL (ref 0.1–1.0)
Monocytes Relative: 7 %
Neutro Abs: 7.5 10*3/uL (ref 1.7–7.7)
Neutrophils Relative %: 56 %
Platelets: 352 10*3/uL (ref 150–400)
RBC: 4.65 MIL/uL (ref 3.87–5.11)
RDW: 13 % (ref 11.5–15.5)
WBC: 13.4 10*3/uL — ABNORMAL HIGH (ref 4.0–10.5)
nRBC: 0 % (ref 0.0–0.2)

## 2021-06-08 LAB — COMPREHENSIVE METABOLIC PANEL
ALT: 15 U/L (ref 0–44)
AST: 22 U/L (ref 15–41)
Albumin: 4.6 g/dL (ref 3.5–5.0)
Alkaline Phosphatase: 49 U/L (ref 38–126)
Anion gap: 12 (ref 5–15)
BUN: 9 mg/dL (ref 6–20)
CO2: 23 mmol/L (ref 22–32)
Calcium: 9.7 mg/dL (ref 8.9–10.3)
Chloride: 104 mmol/L (ref 98–111)
Creatinine, Ser: 0.71 mg/dL (ref 0.44–1.00)
GFR, Estimated: >60 mL/min (ref >60)
Glucose, Bld: 90 mg/dL (ref 70–99)
Potassium: 3.9 mmol/L (ref 3.5–5.1)
Sodium: 139 mmol/L (ref 135–145)
Total Bilirubin: 0.4 mg/dL (ref 0.3–1.2)
Total Protein: 8.1 g/dL (ref 6.5–8.1)

## 2021-06-08 LAB — HCG, SERUM, QUALITATIVE: Preg, Serum: NEGATIVE

## 2021-06-08 LAB — D-DIMER, QUANTITATIVE: D-Dimer, Quant: <0.27 ug/mL-FEU (ref 0.00–0.50)

## 2021-06-08 LAB — TROPONIN I (HIGH SENSITIVITY): Troponin I (High Sensitivity): <2 ng/L

## 2021-06-08 MED ORDER — MAALOX MAX 400-400-40 MG/5ML PO SUSP: 10.0000 mL | Freq: Four times a day (QID) | ORAL | 0 refills | Status: AC | PRN

## 2021-06-08 MED ORDER — PANTOPRAZOLE SODIUM 40 MG PO TBEC: 40.0000 mg | DELAYED_RELEASE_TABLET | Freq: Every day | ORAL | 0 refills | Status: AC

## 2021-06-08 MED ORDER — DICYCLOMINE HCL 20 MG PO TABS: 20.0000 mg | ORAL_TABLET | Freq: Three times a day (TID) | ORAL | 0 refills | Status: AC | PRN

## 2021-06-08 NOTE — ED Triage Notes (Addendum)
C/o med sternal chest pain x 2 hrs ago , increased pain with deep breathing and movt , pt states pain has improved since arrival , hx of anxiety

## 2021-06-08 NOTE — Discharge Instructions (Signed)
You have been seen in the Emergency Department (ED) today for chest pain.  As we have discussed todays test results are normal, but you may require further testing.  Please follow up with the recommended doctor as instructed above in these documents regarding todays emergent visit and your recent symptoms to discuss further management.  Continue to take your regular medications.  Return to the Emergency Department (ED) if you experience any worsening chest pain/pressure/tightness, difficulty breathing, or sudden sweating, or other symptoms that concern you.

## 2021-06-08 NOTE — ED Provider Notes (Signed)
Emergency Department Provider Note   I have reviewed the triage vital signs and the nursing notes.   HISTORY  Chief Complaint Chest Pain   HPI Shaterica Kusak is a 36 y.o. female past medical history reviewed below presents to the emergency department with acute onset sharp chest pain while at home this evening.  She describes the pain as initially bilateral and in multiple points.  The pain migrated throughout the chest but has since settled just left of the sternum in the lower third of her chest.  Pain is worse with inspiration.  Denies feeling specifically short of breath.  Fevers or chills.  No vomiting.  No pain into the abdomen.  No prior history of DVT or PE.    Review of Systems  Constitutional: No fever/chills Eyes: No visual changes. ENT: No sore throat. Cardiovascular: Positive atypical chest pain. Respiratory: Denies shortness of breath. Gastrointestinal: No abdominal pain.  No nausea, no vomiting.  No diarrhea.  No constipation. Genitourinary: Negative for dysuria. Musculoskeletal: Negative for back pain. Skin: Negative for rash. Neurological: Negative for headaches, focal weakness or numbness.  10-point ROS otherwise negative.  ____________________________________________   PHYSICAL EXAM:  VITAL SIGNS: ED Triage Vitals  Enc Vitals Group     BP 06/08/21 2036 119/75     Pulse Rate 06/08/21 2036 90     Resp 06/08/21 2036 16     Temp 06/08/21 2036 97.8 F (36.6 C)     Temp Source 06/08/21 2036 Oral     SpO2 06/08/21 2036 100 %     Weight 06/08/21 2033 215 lb (97.5 kg)     Height 06/08/21 2033 5\' 6"  (1.676 m)   Constitutional: Alert and oriented. Well appearing and in no acute distress. Eyes: Conjunctivae are normal.  Head: Atraumatic. Nose: No congestion/rhinnorhea. Mouth/Throat: Mucous membranes are moist.   Neck: No stridor.   Cardiovascular: Normal rate, regular rhythm. Good peripheral circulation. Grossly normal heart sounds.   Respiratory:  Normal respiratory effort.  No retractions. Lungs CTAB. Gastrointestinal: Soft and nontender. No distention.  Musculoskeletal: No lower extremity tenderness nor edema. No gross deformities of extremities. Neurologic:  Normal speech and language. No gross focal neurologic deficits are appreciated.  Skin:  Skin is warm, dry and intact. No rash noted.  ____________________________________________   LABS (all labs ordered are listed, but only abnormal results are displayed)  Labs Reviewed  CBC WITH DIFFERENTIAL/PLATELET - Abnormal; Notable for the following components:      Result Value   WBC 13.4 (*)    Lymphs Abs 4.8 (*)    All other components within normal limits  COMPREHENSIVE METABOLIC PANEL  D-DIMER, QUANTITATIVE  HCG, SERUM, QUALITATIVE  TROPONIN I (HIGH SENSITIVITY)   ____________________________________________  EKG   EKG Interpretation  Date/Time:  Monday June 08 2021 20:39:24 EST Ventricular Rate:  87 PR Interval:  134 QRS Duration: 82 QT Interval:  360 QTC Calculation: 433 R Axis:   34 Text Interpretation: Normal sinus rhythm Nonspecific T wave abnormality Abnormal ECG When compared with ECG of 23-Feb-2021 01:27, PREVIOUS ECG IS PRESENT Similar to Sep 19th 2022 tracing Confirmed by Nanda Quinton [phone]) on 06/08/2021 9:20:09 PM        ____________________________________________  RADIOLOGY  DG Chest Portable 1 View  Result Date: 06/08/2021 CLINICAL DATA:  chest pain. 36 y/o female with mid sternal chest pain x 2 hrs ago , increased pain with deep breathing and movement EXAM: PORTABLE CHEST 1 VIEW COMPARISON:  Chest x-ray 10/09/2020, CT chest 11/10/2016  FINDINGS: The heart and mediastinal contours are within normal limits. No focal consolidation. No pulmonary edema. No pleural effusion. No pneumothorax. No acute osseous abnormality. IMPRESSION: No active disease. Electronically Signed   By: Iven Finn M.D.   On: 06/08/2021 22:25     ____________________________________________   PROCEDURES  Procedure(s) performed:   Procedures  None ____________________________________________   INITIAL IMPRESSION / ASSESSMENT AND PLAN / ED COURSE  Pertinent labs & imaging results that were available during my care of the patient were reviewed by me and considered in my medical decision making (see chart for details).    Medical Decision Making: Summary:  Patient presents to the ED with CP x 2 hours. Patient with mid-sternal pain. Pain worse with deep breathing or movement. Low risk by HEART score. PERC negative.    This patient is Presenting for Evaluation of CP, which does require a range of treatment options, and is a complaint that involves a high risk of morbidity and mortality.  The Differential Diagnoses includes all life-threatening causes for chest pain. This includes but is not exclusive to acute coronary syndrome, aortic dissection, pulmonary embolism, cardiac tamponade, community-acquired pneumonia, pericarditis, musculoskeletal chest wall pain, etc.   I decided to review pertinent External Data, and in summary patient's last ED visit was 02/22/21 for allergic reaction.   Clinical Laboratory Tests Ordered, included CBC, Metabolic panel, Pregnancy test, and troponin . Review indicates troponin is negative. Pregnancy is negative. D-dimer negative. Mild leukocytosis. No infection symptoms.    Radiologic Tests Ordered, included CXR. I independently reviewed the imaging and no not see any evidence of PNA.   Cardiac Monitor Tracing which shows NSR.    At this time, I do not feel there is any life-threatening condition present. I have reviewed and discussed all results (EKG, imaging, lab, urine as appropriate), exam findings with patient. I have reviewed nursing notes and appropriate previous records.  I feel the patient is safe to be discharged home without further emergent workup. Discussed usual and customary  return precautions. Patient and family (if present) verbalize understanding and are comfortable with this plan.  Patient will follow-up with their primary care provider. If they do not have a primary care provider, information for follow-up has been provided to them. All questions have been answered.       ____________________________________________  FINAL CLINICAL IMPRESSION(S) / ED DIAGNOSES  Final diagnoses:  Atypical chest pain    NEW OUTPATIENT MEDICATIONS STARTED DURING THIS VISIT:  Discharge Medication List as of 06/08/2021 11:34 PM     START taking these medications   Details  alum & mag hydroxide-simeth (MAALOX MAX) 400-400-40 MG/5ML suspension Take 10 mLs by mouth every 6 (six) hours as needed for indigestion., Starting Mon 06/08/2021, Normal    dicyclomine (BENTYL) 20 MG tablet Take 1 tablet (20 mg total) by mouth 3 (three) times daily as needed for spasms., Starting Mon 06/08/2021, Normal    pantoprazole (PROTONIX) 40 MG tablet Take 1 tablet (40 mg total) by mouth daily., Starting Mon 06/08/2021, Normal        Note:  This document was prepared using Dragon voice recognition software and may include unintentional dictation errors.  Nanda Quinton, MD, [facility]    Zyionna Pesce, [facility], MD 06/12/21 2041

## 2022-03-18 IMAGING — CT CT HEAD W/O CM
3 of 4 series · 15 of 47 positions shown, 18 images · non-contrast
Comparison: None.

CLINICAL DATA: Posttraumatic headache after fall.

EXAM:
CT HEAD WITHOUT CONTRAST
CT CERVICAL SPINE WITHOUT CONTRAST
TECHNIQUE: Multidetector CT imaging of the head and cervical spine was
performed following the standard protocol without intravenous
contrast. Multiplanar CT image reconstructions of the cervical spine
were also generated.

[Series 5: head 5.0 h30s · axial · 0.48mm/px · z∈[-205,-105]mm · 9 of 26 slices shown, 12 images]
[im 3/26  brain]
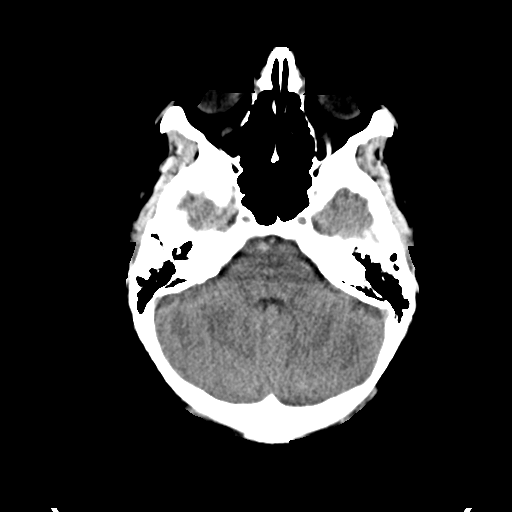
[im 3/26  bone]
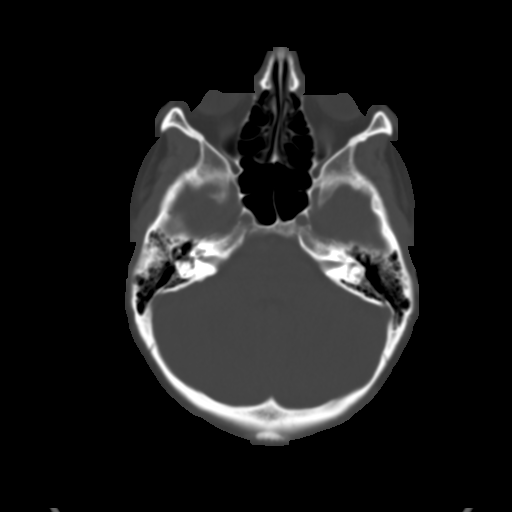
[im 5/26  brain]
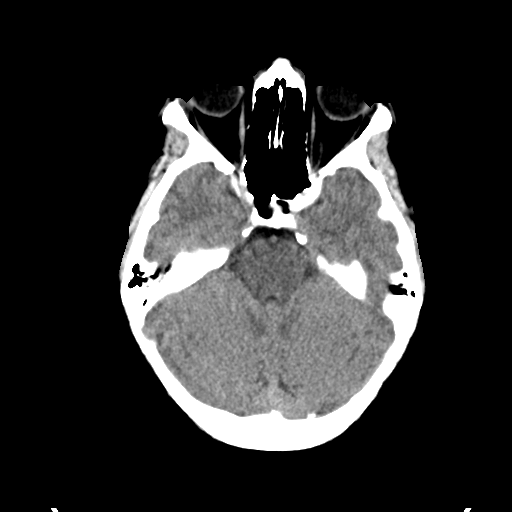
[im 7/26  brain]
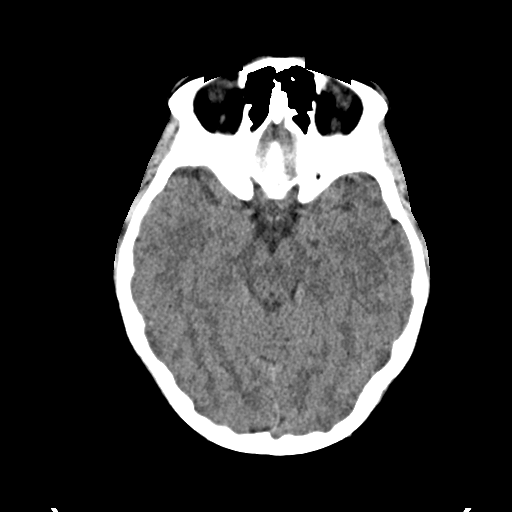
[im 11/26  brain]
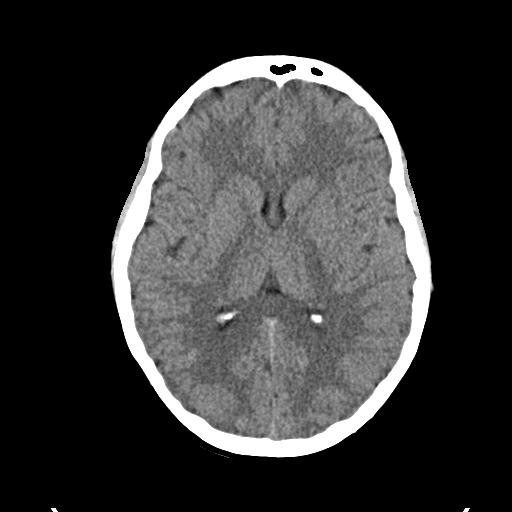
[im 13/26  brain]
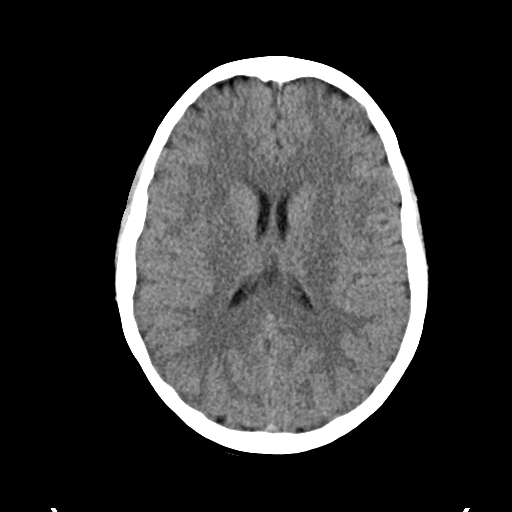
[im 13/26  bone]
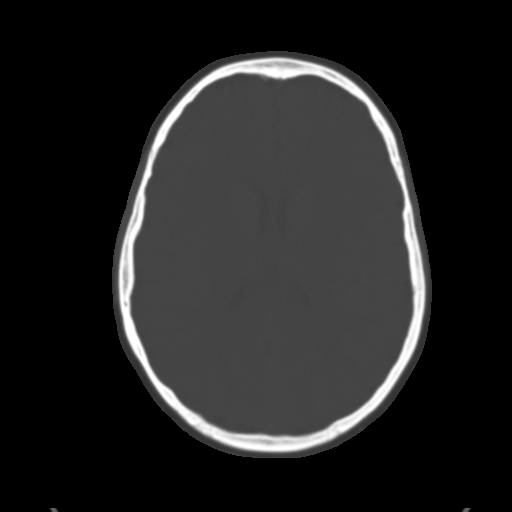
[im 15/26  brain]
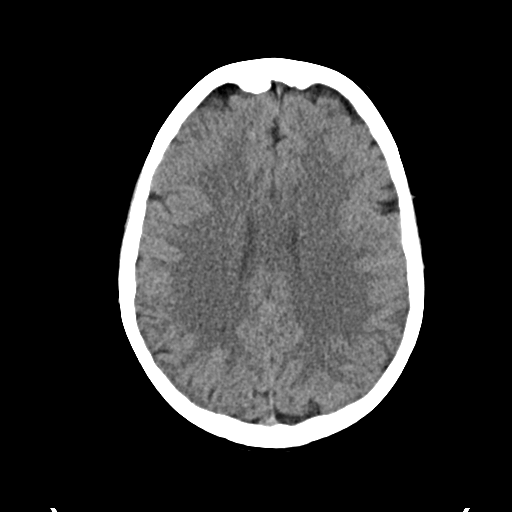
[im 19/26  brain]
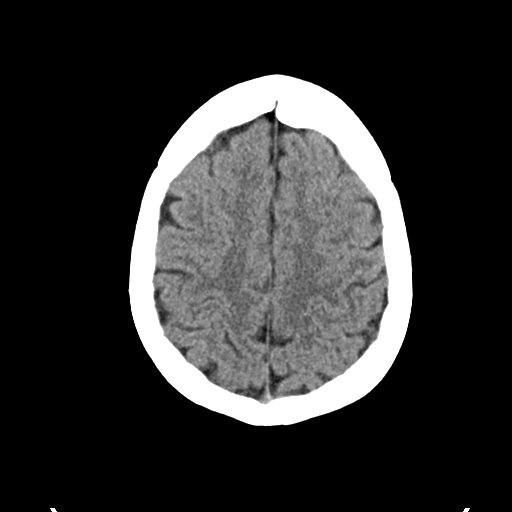
[im 21/26  brain]
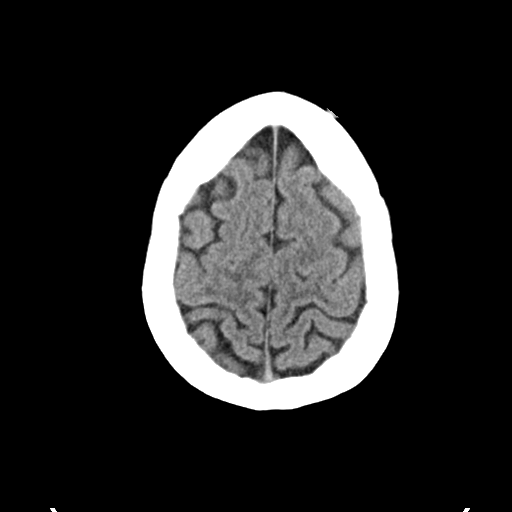
[im 23/26  brain]
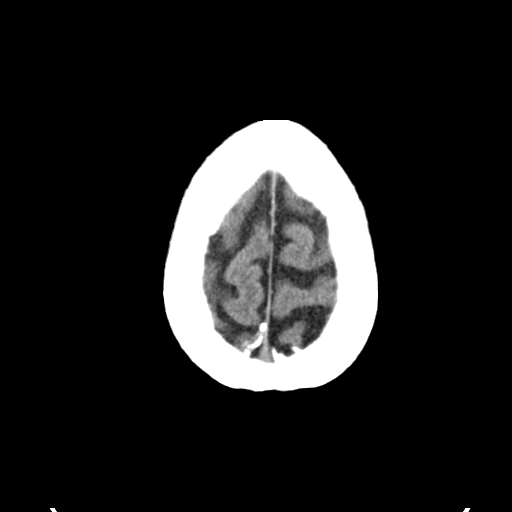
[im 23/26  bone]
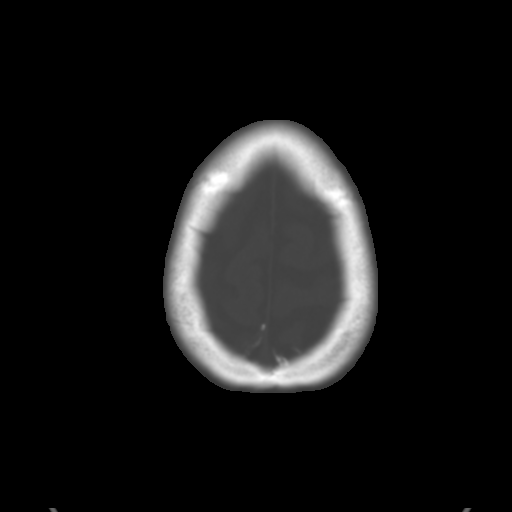

[Series 7: head 3.0 mpr cor · coronal · 0.34mm/px · 3 of 71 slices shown]
[im 24/71  brain]
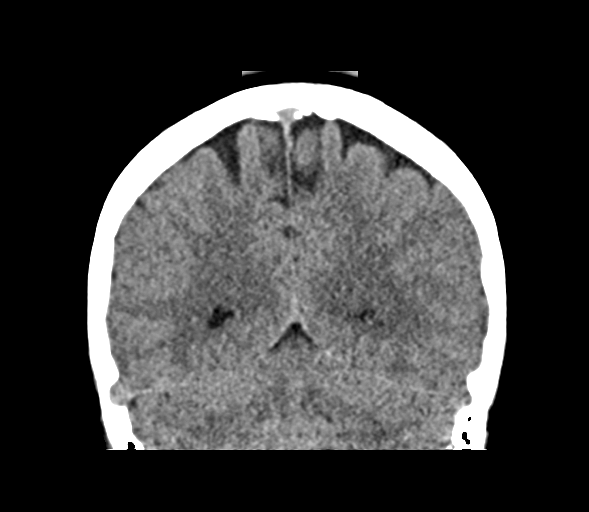
[im 32/71  brain]
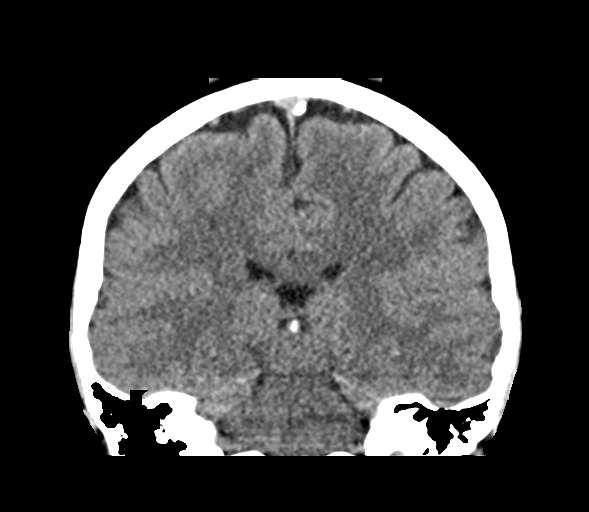
[im 39/71  brain]
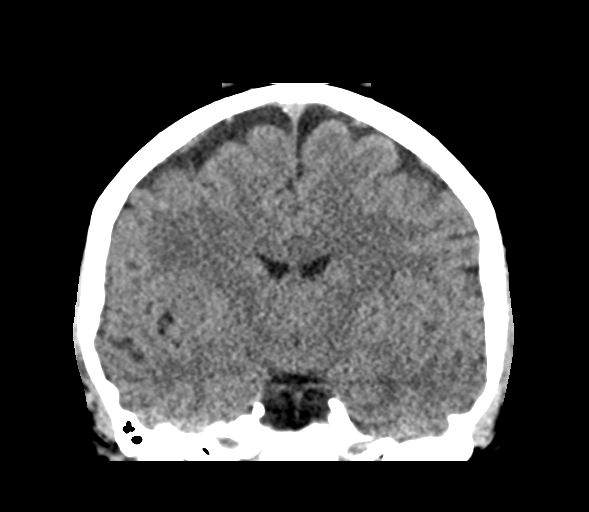

[Series 8: head 3.0 mpr sag · sagittal · 0.33mm/px · 3 of 67 slices shown]
[im 23/67  brain]
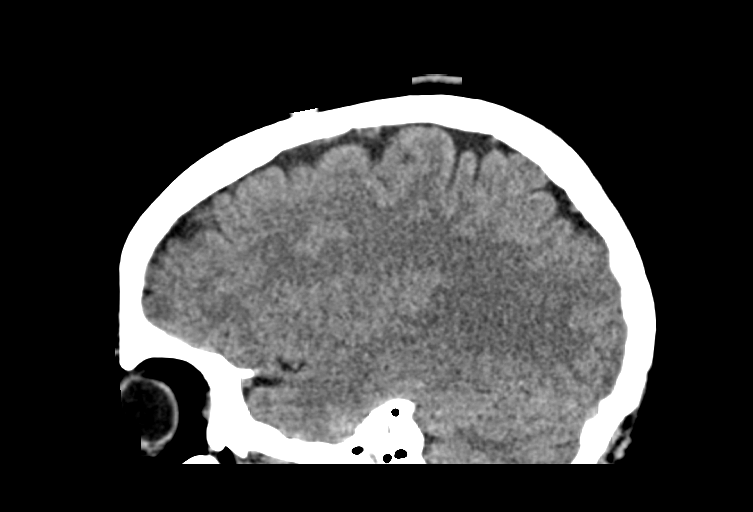
[im 34/67  brain]
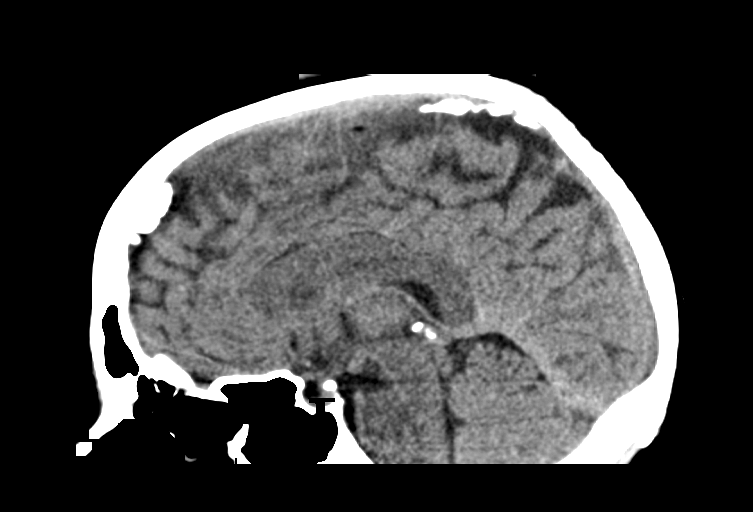
[im 45/67  brain]
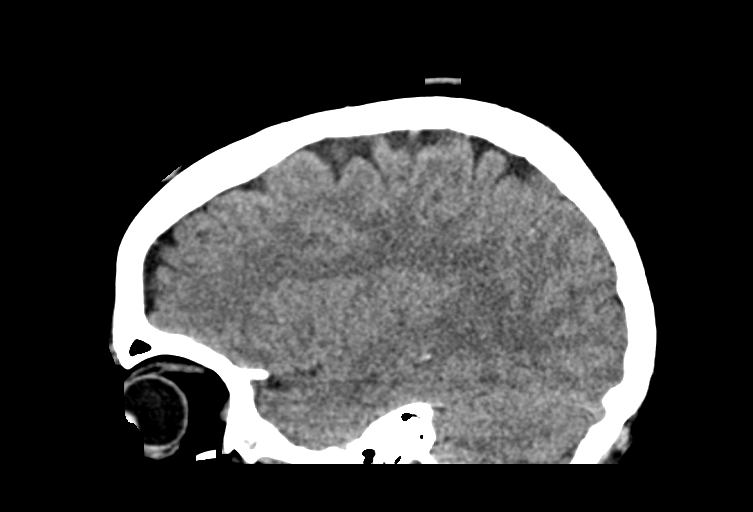

[15 of 47 positions shown; findings below may reference images not displayed]

FINDINGS: CT HEAD FINDINGS

Brain: No evidence of acute infarction, hemorrhage, hydrocephalus,
extra-axial collection or mass lesion/mass effect.

Vascular: No hyperdense vessel or unexpected calcification.

Skull: Normal. Negative for fracture or focal lesion.

Sinuses/Orbits: No acute finding.

Other: None.

CT CERVICAL SPINE FINDINGS

Alignment: Normal.

Skull base and vertebrae: No acute fracture. No primary bone lesion
or focal pathologic process.

Soft tissues and spinal canal: No prevertebral fluid or swelling. No
visible canal hematoma.

Disc levels:  Normal.

Upper chest: Negative.

Other: None.
IMPRESSION: Normal head CT.

Normal cervical spine.

## 2023-01-03 ENCOUNTER — Other Ambulatory Visit: Payer: Self-pay | Admitting: Student

## 2023-01-03 DIAGNOSIS — R222 Localized swelling, mass and lump, trunk: Secondary | ICD-10-CM

## 2023-01-21 ENCOUNTER — Encounter: Payer: Self-pay | Admitting: Student

## 2023-01-24 ENCOUNTER — Encounter: Payer: Self-pay | Admitting: Student

## 2023-01-24 ENCOUNTER — Ambulatory Visit
Admission: RE | Admit: 2023-01-24 | Discharge: 2023-01-24 | Disposition: A | Discharge status: Home / Self Care | Payer: No Typology Code available for payment source | Source: Ambulatory Visit | Attending: Student | Admitting: Student

## 2023-01-24 DIAGNOSIS — R222 Localized swelling, mass and lump, trunk: Secondary | ICD-10-CM

## 2023-01-24 MED ORDER — IOPAMIDOL (ISOVUE-300) INJECTION 61%
500.0000 mL | Freq: Once | INTRAVENOUS | Status: AC | PRN
  Administered 2023-01-24: 75 mL via INTRAVENOUS
